# Patient Record
Sex: Male | Born: 2000 | Hispanic: Yes | Marital: Single | Smoking: Never smoker
Health system: Southern US, Community
[De-identification: ages and names within clinical notes are randomized; demographics above are authoritative.]

---
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
## 2023-04-16 ENCOUNTER — Emergency Department
Admission: EM | Admit: 2023-04-16 | Discharge: 2023-04-16 | Disposition: A | Discharge status: Home / Self Care | Payer: Medicaid Other | Attending: Emergency Medicine | Admitting: Emergency Medicine

## 2023-04-16 ENCOUNTER — Other Ambulatory Visit: Payer: Self-pay

## 2023-04-16 DIAGNOSIS — T7840XA Allergy, unspecified, initial encounter: Secondary | ICD-10-CM | POA: Diagnosis present

## 2023-04-16 DIAGNOSIS — T783XXA Angioneurotic edema, initial encounter: Secondary | ICD-10-CM | POA: Insufficient documentation

## 2023-04-16 MED ORDER — DEXAMETHASONE SODIUM PHOSPHATE 10 MG/ML IJ SOLN
10.0000 mg | Freq: Once | INTRAMUSCULAR | Status: AC
  Administered 2023-04-16: 10 mg via INTRAVENOUS
  Filled 2023-04-16: qty 1

## 2023-04-16 NOTE — ED Provider Notes (Signed)
[facility] Provider Note    None    (approximate)   History   Allergic Reaction   HPI  Jeremy Schmidt is a 22 y.o. male who presents to the ED for evaluation of Allergic Reaction   Review and ED visit from 2 years ago he was seen for facial swelling and diagnosed with angioedema.  Patient presents to the ED by POV for evaluation of "the same thing."  Reports symptoms started in the past 2-3 hours.  He took 50 mg of Benadryl around 11:30 PM, another 50 mg of Benadryl within 30 minutes of arrival.  Reports right sided periorbital facial swelling without trauma or clear precipitators.  No wheezing, pruritic spots on skin, no rash.  No abdominal pain or emesis.  No syncope, dizziness or falls.  Reports that he has never had provide himself an EpiPen in the past.  Takes no regular prescription medications including antihypertensives.  Reports he had a tight throat sensation prior to arrival since improved.  Reports he is feeling better   Physical Exam   Triage Vital Signs: ED Triage Vitals  Encounter Vitals Group     BP      Systolic BP Percentile      Diastolic BP Percentile      Pulse      Resp      Temp      Temp src      SpO2      Weight      Height      Head Circumference      Peak Flow      Pain Score      Pain Loc      Pain Education      Exclude from Growth Chart     Most recent vital signs: Vitals:   04/16/23 0215  BP: (!) 143/95  Pulse: 77  Resp: 18  Temp: 98 F (36.7 C)  SpO2: 100%    General: Awake, no distress.  CV:  Good peripheral perfusion.  Resp:  Normal effort.  Abd:  No distention.  MSK:  No deformity noted.  Neuro:  No focal deficits appreciated. Other:       ED Results / Procedures / Treatments   Labs (all labs ordered are listed, but only abnormal results are displayed) Labs Reviewed - No data to display  EKG   RADIOLOGY   Official radiology report(s): No results  found.  PROCEDURES and INTERVENTIONS:  .1-3 Lead EKG Interpretation  Performed by: Delton Prairie, MD Authorized by: Delton Prairie, MD     Interpretation: normal     ECG rate:  70   ECG rate assessment: normal     Rhythm: sinus rhythm     Ectopy: none     Conduction: normal     Medications  dexamethasone (DECADRON) injection 10 mg (10 mg Intravenous Given 04/16/23 0222)     IMPRESSION / MDM / ASSESSMENT AND PLAN / ED COURSE  I reviewed the triage vital signs and the nursing notes.  Differential diagnosis includes, but is not limited to, angioedema, trauma, anaphylaxis  {Patient presents with symptoms of an acute illness or injury that is potentially life-threatening.  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  Presents with resolving symptoms after taking 100 mg of p.o. Benadryl prior to arrival.  We add IV Decadron and observed for about 3 hours with continued improvement.  Suitable for outpatient management.  No clear precipitators.  Referred to outpatient allergy, discussed ED return precautions.  Clinical Course as of 04/16/23 0502  Caleen Essex Apr 16, 2023  0222 reassessed [DS]  0308 Reassessed. Improving some. No worse [DS]  0457 Reassessed ,  swelling has essentially resolved.  Looks well when he is ready to go. [DS]    Clinical Course User Index [DS] Delton Prairie, MD     FINAL CLINICAL IMPRESSION(S) / ED DIAGNOSES   Final diagnoses:  Angioedema, initial encounter     Rx / DC Orders   ED Discharge Orders     None        Note:  This document was prepared using Dragon voice recognition software and may include unintentional dictation errors.   Delton Prairie, MD 04/16/23 [phone]

## 2023-04-16 NOTE — ED Triage Notes (Signed)
Pt reports swelling to right eye and feeling scratchy in his throat that began around 2330 tonight, Pt states he took benadryl at 2330 that helped initially but swelling came back, pt also took 2 benadryl 30 min PTA. Pt denies difficulty breathing.

## 2023-04-16 NOTE — Discharge Instructions (Addendum)
Reach out to [facility]. They have a main office in [place], but also should have an office in [place]

## 2023-12-03 ENCOUNTER — Ambulatory Visit: Admitting: Physician Assistant

## 2024-01-06 ENCOUNTER — Other Ambulatory Visit: Payer: Self-pay

## 2024-01-06 ENCOUNTER — Encounter: Payer: Self-pay | Admitting: Physical Therapy

## 2024-01-06 ENCOUNTER — Ambulatory Visit: Attending: Orthopedic Surgery | Admitting: Physical Therapy

## 2024-01-06 DIAGNOSIS — M25561 Pain in right knee: Secondary | ICD-10-CM | POA: Diagnosis present

## 2024-01-06 DIAGNOSIS — R600 Localized edema: Secondary | ICD-10-CM | POA: Insufficient documentation

## 2024-01-06 DIAGNOSIS — M6281 Muscle weakness (generalized): Secondary | ICD-10-CM | POA: Diagnosis present

## 2024-01-06 DIAGNOSIS — R2681 Unsteadiness on feet: Secondary | ICD-10-CM | POA: Insufficient documentation

## 2024-01-06 NOTE — Therapy (Signed)
 OUTPATIENT PHYSICAL THERAPY LOWER EXTREMITY EVALUATION  Patient Name: Jeremy Schmidt MRN: [id number] DOB:07-05-2000, 23 y.o., male Today's Date: 01/06/2024   PT End of Session - 01/06/24 1545     Visit Number 1    Number of Visits --   1-2x/week   Date for PT Re-Evaluation 03/02/24    Authorization Type [employer]    PT Start Time 1500    PT Stop Time 1526    PT Time Calculation (min) 26 min          History reviewed. No pertinent past medical history. History reviewed. No pertinent surgical history. There are no active problems to display for this patient.   PCP: Jeremy Lenis, MD (Inactive)  REFERRING PROVIDER: Yvone Rush, MD  THERAPY DIAG:  Right knee pain, unspecified chronicity - Plan: PT plan of care cert/re-cert  Muscle weakness - Plan: PT plan of care cert/re-cert  Unsteadiness on feet - Plan: PT plan of care cert/re-cert  Localized edema - Plan: PT plan of care cert/re-cert  REFERRING DIAG: S/P R Lateral Meniscus Repair    Rationale for Evaluation and Treatment:  Rehabilitation  SUBJECTIVE:  PERTINENT PAST HISTORY:  S/P R lateral meniscus repair         PRECAUTIONS:   OP Date: 12/08/2023  2 weeks 12/22/2023  4 weeks 01/05/2024  6 weeks 01/19/2024  8 weeks 02/02/2024  10 weeks 02/16/2024  12 weeks 03/01/2024    WEIGHT BEARING RESTRICTIONS see protocol  FALLS:  Has patient fallen in last 6 months? No, Number of falls: 0  MOI/History of condition:  Onset date: 7/16  SUBJECTIVE STATEMENT  Pt is a 23 y.o. male who presents to clinic with chief complaint of R knee pain and stiffness following R lateral mensicus repair on 7/16.  He had a twisting injury when he rolled off the sofa and his foot was captured.  He plays baseball on the weekends.  He is not wearing a brace or using crutches on eval, states that these were provided but he wanted to make sure his knee still worked.  Provided protocol but did not bring to clinic   Red flags:  denies    Pain:  Are you having pain? 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  Assistive Device: na  Hand Dominance: na  Patient Goals/Specific Activities: get back to baseball and work   OBJECTIVE:   DIAGNOSTIC FINDINGS:  None available in epic  GENERAL OBSERVATION/GAIT: Antalgic gait, no brace, ace bandage  SENSATION: Light touch: Appears intact  PALPATION: Diffuse pain and mild swelling R knee   LE MMT:  MMT Right (Eval) Left (Eval)  Hip flexion (L2, L3)    Knee extension (L3)    Knee flexion    Hip abduction    Hip extension    Hip external rotation    Hip internal rotation    Hip adduction    Ankle dorsiflexion (L4)    Ankle plantarflexion (S1)    Ankle inversion    Ankle eversion    Great Toe ext (L5)    Grossly     (Blank rows = not tested, score listed is out of 5 possible points OR may be listed in lbs of force.  N = WNL, D = diminished, C = clear for gross weakness  with myotome testing, * = concordant pain with testing)  LE ROM:  ROM Right (Eval) Left (Eval)  Hip flexion    Hip extension    Hip abduction    Hip adduction    Hip internal rotation    Hip external rotation    Knee extension 12 0  Knee flexion 45 130  Ankle dorsiflexion    Ankle plantarflexion    Ankle inversion    Ankle eversion     (Blank rows = not tested, N = WNL, * = concordant pain with testing)  Functional Tests  Eval                                                             PATIENT SURVEYS:  LEFS: 50/80   TODAY'S TREATMENT:  Therapeutic Exercise: Creating, reviewing, and completing below HEP   PATIENT EDUCATION ([place]/HM):  POC, diagnosis, prognosis, HEP, and outcome measures.  Pt educated via explanation, demonstration, and  handout (HEP).  Pt confirms understanding verbally.   HOME EXERCISE PROGRAM: Access Code: [email or web address] URL: [email or web address] Date: 01/06/2024 Prepared by: Jeremy Schmidt  Exercises - Long Sitting Quad Set with Towel Roll Under Heel  - 3-4 x daily - 7 x weekly - 2 sets - 10 reps - 5'' hold - Supine Heel Slide with Strap  - 2 x daily - 7 x weekly - 3 sets - 10 reps  Treatment priorities   Eval        Progress strength and ROM per protocol                                          ASSESSMENT:  CLINICAL IMPRESSION: Jeremy Schmidt is a 23 y.o. male who presents to clinic with signs and sxs consistent with R knee pain and stiffness following R lateral mensicus repair on 7/16.  Provided knee brace and crutches by MD but d/c'd on his own.  Discussed importance of following restrictions which typically include partial w/b and brace at this point; he confirms understanding.  Pt will bring protocol tomorrow.  Jeremy Schmidt will benefit from skilled PT to address relevant deficits and improve knee strength and ROM to allow return to PLOF.   OBJECTIVE IMPAIRMENTS: Pain, LE strength, gait, balance, R knee ROM  ACTIVITY LIMITATIONS: standing, walking, squatting, lifting, running, recreation  PERSONAL FACTORS: See medical history and pertinent history   REHAB POTENTIAL: Good  CLINICAL DECISION MAKING: Evolving/moderate complexity  EVALUATION COMPLEXITY: Moderate   GOALS:   SHORT TERM GOALS: Target date: 02/03/2024   Jeremy Schmidt will be >75% HEP compliant to improve carryover between sessions and facilitate independent management of condition  Evaluation: ongoing Goal status: INITIAL   LONG TERM GOALS: Target date: 03/02/2024   Jeremy Schmidt will self report >/= 50% decrease in pain from evaluation to improve function in daily tasks  Evaluation/Baseline: 6/10 max pain Goal status: INITIAL   2.  Jeremy Schmidt will show a >/= 70/80 pt improvement in LEFS score (MCID is ~11% or 9 pts) as a  proxy for functional improvement   Evaluation/Baseline: 50 pts Goal status: INITIAL   3.  Jeremy Schmidt will be able to return to strength training, not limited by pain  Evaluation/Baseline: limited Goal status:  INITIAL   4.  Jeremy Schmidt will achieve full knee ROM to improve ability to complete transfers, squat, and navigate steps  Evaluation/Baseline: 12-45 degrees Goal status: INITIAL    PLAN: PT FREQUENCY: 1-2x/week  PT DURATION: 8 weeks  PLANNED INTERVENTIONS:  97164- PT Re-evaluation, 97110-Therapeutic exercises, 97530- Therapeutic activity, [id number]- Neuromuscular re-education, 97535- Self Care, 02859- Manual therapy, [id number]- Gait training, [id number]- Aquatic Therapy, [phone]- Electrical stimulation (manual), [id number]- Vasopneumatic device, [id number]- Traction (mechanical), [id number]- Ionotophoresis 4mg /ml Dexamethasone , Taping, Dry Needling, Joint manipulation, and Spinal manipulation.   Jeremy Schmidt PT, DPT 01/06/2024, 4:38 PM  I just finished a MCD eval/recert.  Name: Mung E Helf  MRN: [id number] Please request 2x/week for 8 weeks.  Check all conditions that are expected to impact treatment: Musculoskeletal disorders   I DID put a charge in.  Check all possible CPT codes: 02889- Therapeutic Exercise, [phone]- Neuro Re-education, [phone] - Gait Training, [phone] - Manual Therapy, 97530 - Therapeutic Activities, 97535 - Self Care, [phone] - Re-evaluation, [id number] - Mechanical traction, and [id number] - Aquatic therapy   Thank you!  MCD - Secure

## 2024-01-07 ENCOUNTER — Ambulatory Visit

## 2024-01-07 DIAGNOSIS — M25561 Pain in right knee: Secondary | ICD-10-CM

## 2024-01-07 DIAGNOSIS — R2681 Unsteadiness on feet: Secondary | ICD-10-CM

## 2024-01-07 DIAGNOSIS — R600 Localized edema: Secondary | ICD-10-CM

## 2024-01-07 DIAGNOSIS — M6281 Muscle weakness (generalized): Secondary | ICD-10-CM

## 2024-01-07 NOTE — Therapy (Signed)
 OUTPATIENT PHYSICAL THERAPY TREATMENT NOTE  Patient Name: Jeremy Schmidt MRN: [id number] DOB:April 03, 2001, 23 y.o., male Today's Date: 01/07/2024   PT End of Session - 01/07/24 1224     Visit Number 2    Date for PT Re-Evaluation 03/02/24    Authorization Type [employer]    PT Start Time 1220    PT Stop Time 1258    PT Time Calculation (min) 38 min         History reviewed. No pertinent past medical history. History reviewed. No pertinent surgical history. There are no active problems to display for this patient.   PCP: Dariel Lenis, MD (Inactive)  REFERRING PROVIDER: Yvone Rush, MD  THERAPY DIAG:  Right knee pain, unspecified chronicity  Muscle weakness  Unsteadiness on feet  Localized edema  REFERRING DIAG: S/P R Lateral Meniscus Repair    Rationale for Evaluation and Treatment:  Rehabilitation  SUBJECTIVE:  PERTINENT PAST HISTORY:  S/P R lateral meniscus repair         PRECAUTIONS:   OP Date: 12/08/2023  2 weeks 12/22/2023  4 weeks 01/05/2024  6 weeks 01/19/2024  8 weeks 02/02/2024  10 weeks 02/16/2024  12 weeks 03/01/2024    WEIGHT BEARING RESTRICTIONS see protocol  FALLS:  Has patient fallen in last 6 months? No, Number of falls: 0  MOI/History of condition:  Onset date: 7/16  SUBJECTIVE STATEMENT Patient reports no current pain, is wearing knee immobilizer and ace bandage today. Has done some of the quad sets at home.   EVAL: Pt is a 23 y.o. male who presents to clinic with chief complaint of R knee pain and stiffness following R lateral mensicus repair on 7/16.  He had a twisting injury when he rolled off the sofa and his foot was captured.  He plays baseball on the weekends.  He is not wearing a brace or using crutches on eval, states that these were provided but he wanted to make sure his knee still worked.  Provided protocol but did not bring to clinic   Red flags:  denies   Pain:  Are you having pain? 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  Assistive Device: na  Hand Dominance: na  Patient Goals/Specific Activities: get back to baseball and work   OBJECTIVE:   DIAGNOSTIC FINDINGS:  None available in epic  GENERAL OBSERVATION/GAIT: Antalgic gait, no brace, ace bandage  SENSATION: Light touch: Appears intact  PALPATION: Diffuse pain and mild swelling R knee   LE MMT:  MMT Right (Eval) Left (Eval)  Hip flexion (L2, L3)    Knee extension (L3)    Knee flexion    Hip abduction    Hip extension    Hip external rotation    Hip internal rotation    Hip adduction    Ankle dorsiflexion (L4)    Ankle plantarflexion (S1)    Ankle inversion    Ankle eversion    Great Toe ext (L5)    Grossly     (Blank rows = not tested, score listed is out of 5 possible points OR may be listed in lbs of force.  N = WNL, D = diminished, C = clear for gross weakness with myotome testing, * = concordant pain with testing)  LE ROM:  ROM Right (  Eval) Left (Eval) Right 01/07/24  Hip flexion     Hip extension     Hip abduction     Hip adduction     Hip internal rotation     Hip external rotation     Knee extension 12 0 7  Knee flexion 45 130 78  Ankle dorsiflexion     Ankle plantarflexion     Ankle inversion     Ankle eversion      (Blank rows = not tested, N = WNL, * = concordant pain with testing)  Functional Tests  Eval                                                             PATIENT SURVEYS:  LEFS: 50/80   TODAY'S TREATMENT:  Therapeutic Exercise: Creating, reviewing, and completing below HEP   PATIENT EDUCATION ([place]/HM):  POC, diagnosis, prognosis, HEP, and outcome measures.  Pt educated via explanation, demonstration, and handout (HEP).  Pt confirms understanding  verbally.   HOME EXERCISE PROGRAM: Access Code: [email or web address] URL: [email or web address] Date: 01/06/2024 Prepared by: Helene Gasmen  Exercises - Long Sitting Quad Set with Towel Roll Under Heel  - 3-4 x daily - 7 x weekly - 2 sets - 10 reps - 5'' hold - Supine Heel Slide with Strap  - 2 x daily - 7 x weekly - 3 sets - 10 reps  Treatment priorities   Eval        Progress strength and ROM per protocol                                          [facility] Adult PT Treatment:                                                DATE: 01/07/24 Therapeutic Exercise: Supine heel slide with strap 5 2x10 Sidelying hip abduction 2x10 Neuromuscular re-ed: Sitting EOM quad set 5 hold 2x10 SLR with quad set x10 (very slight quad lag on last 2 reps) Self Care: Education provided on postop protocol, expectations on PT, importance of completing HEP and adhering to protocol, making follow up appointments with surgeon   ASSESSMENT:  CLINICAL IMPRESSION: Patient presents to PT in immobilizer brace and ace bandage. He does have [facility] protocol provided from [facility]. Education provided on protocol regarding wearing brace during ambulation and sleeping and maintaining partial weight bearing with use of axillary crutches. Session today focused on quad activation and flexion ROM. He has a very slight quad lag towards the end of 10 repetitions of SLR. Patient was able to tolerate all prescribed exercises with no adverse effects. Patient continues to benefit from skilled PT services and should be progressed as able to improve functional independence.   EVAL: Jeremy Schmidt is a 23 y.o. male who presents to clinic with signs and sxs consistent with R knee pain and stiffness following R lateral mensicus repair on 7/16.  Provided knee brace and crutches by MD but d/c'd on his own.  Discussed importance of following restrictions which typically include partial  w/b and brace at this point; he confirms  understanding.  Pt will bring protocol tomorrow.  Jeremy Schmidt will benefit from skilled PT to address relevant deficits and improve knee strength and ROM to allow return to PLOF.   OBJECTIVE IMPAIRMENTS: Pain, LE strength, gait, balance, R knee ROM  ACTIVITY LIMITATIONS: standing, walking, squatting, lifting, running, recreation  PERSONAL FACTORS: See medical history and pertinent history   REHAB POTENTIAL: Good  CLINICAL DECISION MAKING: Evolving/moderate complexity  EVALUATION COMPLEXITY: Moderate   GOALS:   SHORT TERM GOALS: Target date: 02/03/2024   Galan will be >75% HEP compliant to improve carryover between sessions and facilitate independent management of condition  Evaluation: ongoing Goal status: INITIAL   LONG TERM GOALS: Target date: 03/02/2024   Jeremy Schmidt will self report >/= 50% decrease in pain from evaluation to improve function in daily tasks  Evaluation/Baseline: 6/10 max pain Goal status: INITIAL   2.  Jeremy Schmidt will show a >/= 70/80 pt improvement in LEFS score (MCID is ~11% or 9 pts) as a proxy for functional improvement   Evaluation/Baseline: 50 pts Goal status: INITIAL   3.  Jeremy Schmidt will be able to return to strength training, not limited by pain  Evaluation/Baseline: limited Goal status: INITIAL   4.  Jeremy Schmidt will achieve full knee ROM to improve ability to complete transfers, squat, and navigate steps  Evaluation/Baseline: 12-45 degrees Goal status: INITIAL    PLAN: PT FREQUENCY: 1-2x/week  PT DURATION: 8 weeks  PLANNED INTERVENTIONS:  97164- PT Re-evaluation, 97110-Therapeutic exercises, 97530- Therapeutic activity, [id number]- Neuromuscular re-education, 97535- Self Care, 02859- Manual therapy, [id number]- Gait training, [id number]- Aquatic Therapy, [phone]- Electrical stimulation (manual), [id number]- Vasopneumatic device, [id number]- Traction (mechanical), [id number]- Ionotophoresis 4mg /ml Dexamethasone , Taping, Dry Needling, Joint manipulation, and Spinal  manipulation.   Corean Pouch PTA  01/07/2024, 1:02 PM

## 2024-01-11 ENCOUNTER — Ambulatory Visit

## 2024-01-11 DIAGNOSIS — R600 Localized edema: Secondary | ICD-10-CM

## 2024-01-11 DIAGNOSIS — M25561 Pain in right knee: Secondary | ICD-10-CM | POA: Diagnosis not present

## 2024-01-11 DIAGNOSIS — R2681 Unsteadiness on feet: Secondary | ICD-10-CM

## 2024-01-11 DIAGNOSIS — M6281 Muscle weakness (generalized): Secondary | ICD-10-CM

## 2024-01-11 NOTE — Therapy (Signed)
 OUTPATIENT PHYSICAL THERAPY TREATMENT NOTE  Patient Name: Jeremy Schmidt MRN: [id number] DOB:12/18/2000, 23 y.o., male Today's Date: 01/11/2024   PT End of Session - 01/11/24 1447     Visit Number 3    Date for PT Re-Evaluation 03/02/24    Authorization Type [employer]    PT Start Time 1447    PT Stop Time 1525    PT Time Calculation (min) 38 min          History reviewed. No pertinent past medical history. History reviewed. No pertinent surgical history. There are no active problems to display for this patient.   PCP: Jeremy Lenis, MD (Inactive)  REFERRING PROVIDER: Yvone Rush, MD  THERAPY DIAG:  Right knee pain, unspecified chronicity  Muscle weakness  Unsteadiness on feet  Localized edema  REFERRING DIAG: S/P R Lateral Meniscus Repair    Rationale for Evaluation and Treatment:  Rehabilitation  SUBJECTIVE:  PERTINENT PAST HISTORY:  S/P R lateral meniscus repair         PRECAUTIONS:   OP Date: 12/08/2023  2 weeks 12/22/2023  4 weeks 01/05/2024  6 weeks 01/19/2024  8 weeks 02/02/2024  10 weeks 02/16/2024  12 weeks 03/01/2024    WEIGHT BEARING RESTRICTIONS see protocol  FALLS:  Has patient fallen in last 6 months? No, Number of falls: 0  MOI/History of condition:  Onset date: 7/16  SUBJECTIVE STATEMENT Pt presents to PT with reports of 5/10 R knee pain. Has been compliant with HEP.  EVAL: Pt is a 23 y.o. male who presents to clinic with chief complaint of R knee pain and stiffness following R lateral mensicus repair on 7/16.  He had a twisting injury when he rolled off the sofa and his foot was captured.  He plays baseball on the weekends.  He is not wearing a brace or using crutches on eval, states that these were provided but he wanted to make sure his knee still worked.  Provided protocol but did not bring to clinic   Red flags:  denies   Pain:  Are you having pain? 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  Assistive Device: na  Hand Dominance: na  Patient Goals/Specific Activities: get back to baseball and work   OBJECTIVE:   DIAGNOSTIC FINDINGS:  None available in epic  GENERAL OBSERVATION/GAIT: Antalgic gait, no brace, ace bandage  SENSATION: Light touch: Appears intact  PALPATION: Diffuse pain and mild swelling R knee   LE MMT:  MMT Right (Eval) Left (Eval)  Hip flexion (L2, L3)    Knee extension (L3)    Knee flexion    Hip abduction    Hip extension    Hip external rotation    Hip internal rotation    Hip adduction    Ankle dorsiflexion (L4)    Ankle plantarflexion (S1)    Ankle inversion    Ankle eversion    Great Toe ext (L5)    Grossly     (Blank rows = not tested, score listed is out of 5 possible points OR may be listed in lbs of force.  N = WNL, D = diminished, C = clear for gross weakness with myotome testing, * = concordant pain with testing)  LE ROM:  ROM Right (Eval) Left (Eval) Right 01/07/24 Right  01/11/24  Hip flexion      Hip extension      Hip abduction      Hip adduction      Hip internal rotation      Hip external rotation      Knee extension 12 0 7 5  Knee flexion 45 130 78 84  Ankle dorsiflexion      Ankle plantarflexion      Ankle inversion      Ankle eversion       (Blank rows = not tested, N = WNL, * = concordant pain with testing)  Functional Tests  Eval                                                             PATIENT SURVEYS:  LEFS: 50/80   TODAY'S TREATMENT:  Therapeutic Exercise: Creating, reviewing, and completing below HEP   PATIENT EDUCATION (Jeremy Schmidt/HM):  POC, diagnosis, prognosis, HEP, and outcome measures.  Pt educated via explanation, demonstration, and handout (HEP).  Pt confirms understanding verbally.    HOME EXERCISE PROGRAM: Access Code: [email or web address] URL: [email or web address] Date: 01/11/2024 Prepared by: Jeremy Schmidt  Exercises - Long Sitting Quad Set with Towel Roll Under Heel  - 3-4 x daily - 7 x weekly - 2 sets - 10 reps - 5'' hold - Supine Heel Slide with Strap  - 2 x daily - 7 x weekly - 3 sets - 10 reps - Prone Knee Extension Hang  - 1 x daily - 7 x weekly - 3 sets - 10 reps - 2-3 min hold - Sidelying Hip Abduction  - 1 x daily - 7 x weekly - 3 sets - 10 reps  Treatment priorities   Eval        Progress strength and ROM per protocol                                          [facility] Adult PT Treatment:                                                DATE: 01/11/24 Therapeutic Exercise: Supine QS with heel block 2x10 - 5 hold R Supine heel slide with strap 5 2x10 R Sidelying hip abduction 3x10 R Prone knee ext hang x 3 min Standing calf raises 2x20 Neuromuscular re-ed: Supine QS with heel block 3x10 - 5 hold R Seated QS 2x10 R - 5 hold R Modalities: Attended [place] ESTIM 5/5 (on/off) x 8 min to R quad GameReady 10 min  R knee elevated Medium compression 36 degrees  ASSESSMENT:  CLINICAL IMPRESSION: Pt was able to complete all prescribed exercises with no adverse effect. Today he was able to progress ROM in both R knee flexion and extension. Still not at full knee extension, HEP was updated for knee extension stretching and lateral hip strengthening. Pt is progressing well with therapy, will continue to progress as able per POC.   EVAL: Jeremy Schmidt is a 23 y.o. male who presents to clinic with signs and sxs consistent with R knee  pain and stiffness following R lateral mensicus repair on 7/16.  Provided knee brace and crutches by MD but d/c'd on his own.  Discussed importance of following restrictions which typically include partial w/b and brace at this point; he confirms understanding.  Pt will bring protocol tomorrow.  Jeremy Schmidt will benefit from  skilled PT to address relevant deficits and improve knee strength and ROM to allow return to PLOF.   OBJECTIVE IMPAIRMENTS: Pain, LE strength, gait, balance, R knee ROM  ACTIVITY LIMITATIONS: standing, walking, squatting, lifting, running, recreation  PERSONAL FACTORS: See medical history and pertinent history   REHAB POTENTIAL: Good  CLINICAL DECISION MAKING: Evolving/moderate complexity  EVALUATION COMPLEXITY: Moderate   GOALS:   SHORT TERM GOALS: Target date: 02/03/2024   Aria will be >75% HEP compliant to improve carryover between sessions and facilitate independent management of condition  Evaluation: ongoing Goal status: MET   LONG TERM GOALS: Target date: 03/02/2024   Talis will self report >/= 50% decrease in pain from evaluation to improve function in daily tasks  Evaluation/Baseline: 6/10 max pain Goal status: INITIAL   2.  Mohsin will show a >/= 70/80 pt improvement in LEFS score (MCID is ~11% or 9 pts) as a proxy for functional improvement   Evaluation/Baseline: 50 pts Goal status: INITIAL   3.  Acelin will be able to return to strength training, not limited by pain  Evaluation/Baseline: limited Goal status: INITIAL   4.  Fatih will achieve full knee ROM to improve ability to complete transfers, squat, and navigate steps  Evaluation/Baseline: 12-45 degrees Goal status: INITIAL    PLAN: PT FREQUENCY: 1-2x/week  PT DURATION: 8 weeks  PLANNED INTERVENTIONS:  97164- PT Re-evaluation, 97110-Therapeutic exercises, 97530- Therapeutic activity, [id number]- Neuromuscular re-education, 97535- Self Care, 02859- Manual therapy, [id number]- Gait training, [id number]- Aquatic Therapy, [phone]- Electrical stimulation (manual), [id number]- Vasopneumatic device, [id number]- Traction (mechanical), [id number]- Ionotophoresis 4mg /ml Dexamethasone , Taping, Dry Needling, Joint manipulation, and Spinal manipulation.   Jeremy JAYSON Schmidt PT  01/11/2024, 3:44 PM

## 2024-01-13 ENCOUNTER — Ambulatory Visit

## 2024-01-13 DIAGNOSIS — R600 Localized edema: Secondary | ICD-10-CM

## 2024-01-13 DIAGNOSIS — M25561 Pain in right knee: Secondary | ICD-10-CM | POA: Diagnosis not present

## 2024-01-13 DIAGNOSIS — M6281 Muscle weakness (generalized): Secondary | ICD-10-CM

## 2024-01-13 DIAGNOSIS — R2681 Unsteadiness on feet: Secondary | ICD-10-CM

## 2024-01-13 NOTE — Therapy (Signed)
 OUTPATIENT PHYSICAL THERAPY TREATMENT NOTE  Patient Name: Jeremy Schmidt MRN: [id number] DOB:2000/12/08, 23 y.o., male Today's Date: 01/13/2024   PT End of Session - 01/13/24 1314     Visit Number 4    Date for PT Re-Evaluation 03/02/24    Authorization Type [employer]    PT Start Time 1315    PT Stop Time 1358    PT Time Calculation (min) 43 min    Activity Tolerance Patient tolerated treatment well    Behavior During Therapy [facility] for tasks assessed/performed           History reviewed. No pertinent past medical history. History reviewed. No pertinent surgical history. There are no active problems to display for this patient.   PCP: Dariel Lenis, MD (Inactive)  REFERRING PROVIDER: Yvone Rush, MD  THERAPY DIAG:  Right knee pain, unspecified chronicity  Muscle weakness  Unsteadiness on feet  Localized edema  REFERRING DIAG: S/P R Lateral Meniscus Repair    Rationale for Evaluation and Treatment:  Rehabilitation  SUBJECTIVE:  PERTINENT PAST HISTORY:  S/P R lateral meniscus repair         PRECAUTIONS:   OP Date: 12/08/2023  2 weeks 12/22/2023  4 weeks 01/05/2024  6 weeks 01/19/2024  8 weeks 02/02/2024  10 weeks 02/16/2024  12 weeks 03/01/2024    WEIGHT BEARING RESTRICTIONS see protocol  FALLS:  Has patient fallen in last 6 months? No, Number of falls: 0  MOI/History of condition:  Onset date: 7/16  SUBJECTIVE STATEMENT Pt comes in with less pain, 4/10. Felt good after last session.   EVAL: Pt is a 23 y.o. male who presents to clinic with chief complaint of R knee pain and stiffness following R lateral mensicus repair on 7/16.  He had a twisting injury when he rolled off the sofa and his foot was captured.  He plays baseball on the weekends.  He is not wearing a brace or using crutches on eval, states that these were provided but he wanted to make sure his knee still worked.  Provided protocol but did not bring to clinic   Red flags:  denies   Pain:   Are you having pain? 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  Assistive Device: na  Hand Dominance: na  Patient Goals/Specific Activities: get back to baseball and work   OBJECTIVE:   DIAGNOSTIC FINDINGS:  None available in epic  GENERAL OBSERVATION/GAIT: Antalgic gait, no brace, ace bandage  SENSATION: Light touch: Appears intact  PALPATION: Diffuse pain and mild swelling R knee   LE MMT:  MMT Right (Eval) Left (Eval)  Hip flexion (L2, L3)    Knee extension (L3)    Knee flexion    Hip abduction    Hip extension    Hip external rotation    Hip internal rotation    Hip adduction    Ankle dorsiflexion (L4)    Ankle plantarflexion (S1)    Ankle inversion    Ankle eversion    Great Toe ext (L5)    Grossly     (Blank rows = not tested, score listed is out of 5 possible points OR may be listed in lbs of force.  N = WNL, D = diminished, C = clear for gross weakness with myotome testing, * =  concordant pain with testing)  LE ROM:  ROM Right (Eval) Left (Eval) Right 01/07/24 Right 01/11/24 Right 01/13/24  Hip flexion       Hip extension       Hip abduction       Hip adduction       Hip internal rotation       Hip external rotation       Knee extension 12 0 7 5 3, 0   Knee flexion 45 130 78 84 82  Ankle dorsiflexion       Ankle plantarflexion       Ankle inversion       Ankle eversion        (Blank rows = not tested, N = WNL, * = concordant pain with testing)  Functional Tests  Eval                                                             PATIENT SURVEYS:  LEFS: 50/80   TODAY'S TREATMENT:  Therapeutic Exercise: Creating, reviewing, and completing below HEP   PATIENT EDUCATION ([place]/[facility]):  POC, diagnosis,  prognosis, HEP, and outcome measures.  Pt educated via explanation, demonstration, and handout (HEP).  Pt confirms understanding verbally.   HOME EXERCISE PROGRAM: Access Code: [email or web address] URL: [email or web address] Date: 01/11/2024 Prepared by: Alm Kingdom  Exercises - Long Sitting Quad Set with Towel Roll Under Heel  - 3-4 x daily - 7 x weekly - 2 sets - 10 reps - 5'' hold - Supine Heel Slide with Strap  - 2 x daily - 7 x weekly - 3 sets - 10 reps - Prone Knee Extension Hang  - 1 x daily - 7 x weekly - 3 sets - 10 reps - 2-3 min hold - Sidelying Hip Abduction  - 1 x daily - 7 x weekly - 3 sets - 10 reps  Treatment priorities   Eval        Progress strength and ROM per protocol                                           TREATMENT [facility] Adult PT Treatment:                                      DATE: 01/13/24 Therapeutic Exercise: Supine QS with heel block 1x10 - 5 hold R Supine heel slide with strap 5 2x10 R Sidelying hip abduction 1x10 2x15 Standing calf raises 2x20 SLR with QS 2 x 10 Knee extension isometrics with mobilization seatbelt 2x10  SL balance  Lateral step over   Modalities: Attended [place] ESTIM 5/5 (on/off) x 8 min to R quad GameReady 10 min  R knee elevated Medium compression 36 degrees  [facility] Adult PT Treatment:                                                DATE: 01/11/24 Therapeutic Exercise: Supine QS with heel block 2x10 - 5  hold R Supine heel slide with strap 5 2x10 R Sidelying hip abduction 3x10 R Prone knee ext hang x 3 min Standing calf raises 2x20 Neuromuscular re-ed: Supine QS with heel block 3x10 - 5 hold R Seated QS 2x10 R - 5 hold R Modalities: Attended [place] ESTIM 5/5 (on/off) x 8 min to R quad GameReady 10 min  R knee elevated Medium compression 36 degrees  ASSESSMENT:  CLINICAL IMPRESSION: Pt was able to complete all prescribed exercises with no adverse effect. Today he was able to progress  ROM and achieved full knee extension. Worked on improving quad activation, knee isometrics and single leg weight acceptance today. Educated on descending stairs with walker and affected side first. Pt is progressing well with therapy, will continue to progress as able per POC.   EVAL: Jeremy Schmidt is a 23 y.o. male who presents to clinic with signs and sxs consistent with R knee pain and stiffness following R lateral mensicus repair on 7/16.  Provided knee brace and crutches by MD but d/c'd on his own.  Discussed importance of following restrictions which typically include partial w/b and brace at this point; he confirms understanding.  Pt will bring protocol tomorrow.  Jeremy Schmidt will benefit from skilled PT to address relevant deficits and improve knee strength and ROM to allow return to PLOF.   OBJECTIVE IMPAIRMENTS: Pain, LE strength, gait, balance, R knee ROM  ACTIVITY LIMITATIONS: standing, walking, squatting, lifting, running, recreation  PERSONAL FACTORS: See medical history and pertinent history   REHAB POTENTIAL: Good  CLINICAL DECISION MAKING: Evolving/moderate complexity  EVALUATION COMPLEXITY: Moderate   GOALS:   SHORT TERM GOALS: Target date: 02/03/2024   Jeremy Schmidt will be >75% HEP compliant to improve carryover between sessions and facilitate independent management of condition  Evaluation: ongoing Goal status: MET   LONG TERM GOALS: Target date: 03/02/2024   Jeremy Schmidt will self report >/= 50% decrease in pain from evaluation to improve function in daily tasks  Evaluation/Baseline: 6/10 max pain Goal status: INITIAL   2.  Jeremy Schmidt will show a >/= 70/80 pt improvement in LEFS score (MCID is ~11% or 9 pts) as a proxy for functional improvement   Evaluation/Baseline: 50 pts Goal status: INITIAL   3.  Jeremy Schmidt will be able to return to strength training, not limited by pain  Evaluation/Baseline: limited Goal status: INITIAL   4.  Jeremy Schmidt will achieve full knee ROM to improve  ability to complete transfers, squat, and navigate steps  Evaluation/Baseline: 12-45 degrees Goal status: INITIAL    PLAN: PT FREQUENCY: 1-2x/week  PT DURATION: 8 weeks  PLANNED INTERVENTIONS:  97164- PT Re-evaluation, 97110-Therapeutic exercises, 97530- Therapeutic activity, [id number]- Neuromuscular re-education, 97535- Self Care, [id number]- Manual therapy, [id number]- Gait training, [id number]- Aquatic Therapy, [phone]- Electrical stimulation (manual), [id number]- Vasopneumatic device, [id number]- Traction (mechanical), [id number]- Ionotophoresis 4mg /ml Dexamethasone , Taping, Dry Needling, Joint manipulation, and Spinal manipulation.   Jeremy Schmidt, SPT 01/13/24 2:00 PM

## 2024-01-18 ENCOUNTER — Ambulatory Visit

## 2024-01-18 DIAGNOSIS — M6281 Muscle weakness (generalized): Secondary | ICD-10-CM

## 2024-01-18 DIAGNOSIS — M25561 Pain in right knee: Secondary | ICD-10-CM

## 2024-01-18 NOTE — Therapy (Signed)
 OUTPATIENT PHYSICAL THERAPY TREATMENT NOTE  Patient Name: Jeremy Schmidt MRN: [id number] DOB:2000-08-30, 23 y.o., male Today's Date: 01/18/2024   PT End of Session - 01/18/24 1313     Visit Number 5    Date for PT Re-Evaluation 03/02/24    Authorization Type [employer]    PT Start Time 1315    PT Stop Time 1355    PT Time Calculation (min) 40 min    Activity Tolerance Patient tolerated treatment well    Behavior During Therapy [facility] for tasks assessed/performed            History reviewed. No pertinent past medical history. History reviewed. No pertinent surgical history. There are no active problems to display for this patient.   PCP: Dariel Lenis, MD (Inactive)  REFERRING PROVIDER: Yvone Rush, MD  THERAPY DIAG:  Right knee pain, unspecified chronicity  Muscle weakness  REFERRING DIAG: S/P R Lateral Meniscus Repair    Rationale for Evaluation and Treatment:  Rehabilitation  SUBJECTIVE:  PERTINENT PAST HISTORY:  S/P R lateral meniscus repair         PRECAUTIONS:   OP Date: 12/08/2023  2 weeks 12/22/2023  4 weeks 01/05/2024  6 weeks 01/19/2024  8 weeks 02/02/2024  10 weeks 02/16/2024  12 weeks 03/01/2024    WEIGHT BEARING RESTRICTIONS see protocol  FALLS:  Has patient fallen in last 6 months? No, Number of falls: 0  MOI/History of condition:  Onset date: 7/16  SUBJECTIVE STATEMENT Pt presents to PT with no current pain. Has been compliant with HEP.   EVAL: Pt is a 23 y.o. male who presents to clinic with chief complaint of R knee pain and stiffness following R lateral mensicus repair on 7/16.  He had a twisting injury when he rolled off the sofa and his foot was captured.  He plays baseball on the weekends.  He is not wearing a brace or using crutches on eval, states that these were provided but he wanted to make sure his knee still worked.  Provided protocol but did not bring to clinic   Red flags:  denies   Pain:  Are you having pain? 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  Assistive Device: na  Hand Dominance: na  Patient Goals/Specific Activities: get back to baseball and work   OBJECTIVE:   DIAGNOSTIC FINDINGS:  None available in epic  GENERAL OBSERVATION/GAIT: Antalgic gait, no brace, ace bandage  SENSATION: Light touch: Appears intact  PALPATION: Diffuse pain and mild swelling R knee   LE MMT:  MMT Right (Eval) Left (Eval)  Hip flexion (L2, L3)    Knee extension (L3)    Knee flexion    Hip abduction    Hip extension    Hip external rotation    Hip internal rotation    Hip adduction    Ankle dorsiflexion (L4)    Ankle plantarflexion (S1)    Ankle inversion    Ankle eversion    Great Toe ext (L5)    Grossly     (Blank rows = not tested, score listed is out of 5 possible points OR may be listed in lbs of force.  N = WNL, D = diminished, C = clear for gross weakness with myotome testing, * = concordant pain with testing)  LE ROM:  ROM Right (Eval) Left (Eval) Right 01/07/24 Right 01/11/24 Right 01/13/24 Right 01/18/24  Hip flexion        Hip extension        Hip abduction        Hip adduction        Hip internal rotation        Hip external rotation        Knee extension 12 0 7 5 3, 0  2  Knee flexion 45 130 78 84 82 94  Ankle dorsiflexion        Ankle plantarflexion        Ankle inversion        Ankle eversion         (Blank rows = not tested, N = WNL, * = concordant pain with testing)  Functional Tests  Eval                                                             PATIENT SURVEYS:  LEFS: 50/80   TODAY'S TREATMENT:  Therapeutic Exercise: Creating, reviewing, and completing below HEP   PATIENT EDUCATION ([place]/HM):  POC, diagnosis,  prognosis, HEP, and outcome measures.  Pt educated via explanation, demonstration, and handout (HEP).  Pt confirms understanding verbally.   HOME EXERCISE PROGRAM: Access Code: [email or web address] URL: [email or web address] Date: 01/11/2024 Prepared by: Alm Kingdom  Exercises - Long Sitting Quad Set with Towel Roll Under Heel  - 3-4 x daily - 7 x weekly - 2 sets - 10 reps - 5'' hold - Supine Heel Slide with Strap  - 2 x daily - 7 x weekly - 3 sets - 10 reps - Prone Knee Extension Hang  - 1 x daily - 7 x weekly - 3 sets - 10 reps - 2-3 min hold - Sidelying Hip Abduction  - 1 x daily - 7 x weekly - 3 sets - 10 reps  Treatment priorities   Eval        Progress strength and ROM per protocol                                           TREATMENT [facility] Adult PT Treatment:                                      DATE: 01/18/24 Therapeutic Exercise: Supine SLR with QS 2x10 R Bridge LE on swiss 2x10  S/L hip abd 2x15 R Supine heel slide with strap 5 x 10 R S/L clam 2x15 RTB R Standing calf raises x 20 Wall squat 2x10 Neuromuscular Re-Ed:  Supine QS with heel block x 10 - 5 hold R Attended [place] ESTIM 5/5 (on/off) x 8 min to R quad Wobble board fwd/bwd 2x45 Modalities: GameReady 10 min  R knee elevated Medium compression 36 degrees  [facility] Adult PT Treatment:                                      DATE: 01/13/24 Therapeutic Exercise: Supine  QS with heel block 1x10 - 5 hold R Supine heel slide with strap 5 2x10 R Sidelying hip abduction 1x10 2x15 Standing calf raises 2x20 SLR with QS 2 x 10 Knee extension isometrics with mobilization seatbelt 2x10  SL balance  Lateral step over  Modalities: Attended [place] ESTIM 5/5 (on/off) x 8 min to R quad GameReady 10 min  R knee elevated Medium compression 36 degrees  [facility] Adult PT Treatment:                                                DATE: 01/11/24 Therapeutic Exercise: Supine QS with heel block 2x10 - 5  hold R Supine heel slide with strap 5 2x10 R Sidelying hip abduction 3x10 R Prone knee ext hang x 3 min Standing calf raises 2x20 Neuromuscular re-ed: Supine QS with heel block 3x10 - 5 hold R Seated QS 2x10 R - 5 hold R Modalities: Attended [place] ESTIM 5/5 (on/off) x 8 min to R quad GameReady 10 min  R knee elevated Medium compression 36 degrees  ASSESSMENT:  CLINICAL IMPRESSION: Pt was able to complete all prescribed exercises with no adverse effect. Exercises once again progressed quad and LE strength/balance per protocol. Continues to improve R knee ROM post op. Will continue to progress as tolerated per POC.   EVAL: Mataeo is a 23 y.o. male who presents to clinic with signs and sxs consistent with R knee pain and stiffness following R lateral mensicus repair on 7/16.  Provided knee brace and crutches by MD but d/c'd on his own.  Discussed importance of following restrictions which typically include partial w/b and brace at this point; he confirms understanding.  Pt will bring protocol tomorrow.  Kervin will benefit from skilled PT to address relevant deficits and improve knee strength and ROM to allow return to PLOF.   OBJECTIVE IMPAIRMENTS: Pain, LE strength, gait, balance, R knee ROM  ACTIVITY LIMITATIONS: standing, walking, squatting, lifting, running, recreation  PERSONAL FACTORS: See medical history and pertinent history   REHAB POTENTIAL: Good  CLINICAL DECISION MAKING: Evolving/moderate complexity  EVALUATION COMPLEXITY: Moderate   GOALS:   SHORT TERM GOALS: Target date: 02/03/2024   Christia will be >75% HEP compliant to improve carryover between sessions and facilitate independent management of condition  Evaluation: ongoing Goal status: MET   LONG TERM GOALS: Target date: 03/02/2024   Alazar will self report >/= 50% decrease in pain from evaluation to improve function in daily tasks  Evaluation/Baseline: 6/10 max pain Goal status:  INITIAL   2.  Sava will show a >/= 70/80 pt improvement in LEFS score (MCID is ~11% or 9 pts) as a proxy for functional improvement   Evaluation/Baseline: 50 pts Goal status: INITIAL   3.  Hyder will be able to return to strength training, not limited by pain  Evaluation/Baseline: limited Goal status: INITIAL   4.  Tevin will achieve full knee ROM to improve ability to complete transfers, squat, and navigate steps  Evaluation/Baseline: 12-45 degrees Goal status: INITIAL    PLAN: PT FREQUENCY: 1-2x/week  PT DURATION: 8 weeks  PLANNED INTERVENTIONS:  97164- PT Re-evaluation, 97110-Therapeutic exercises, 97530- Therapeutic activity, [id number]- Neuromuscular re-education, 97535- Self Care, 02859- Manual therapy, [id number]- Gait training, [id number]- Aquatic Therapy, [phone]- Electrical stimulation (manual), [id number]- Vasopneumatic device, [id number]- Traction (mechanical), [id number]- Ionotophoresis 4mg /ml Dexamethasone , Taping, Dry Needling, Joint manipulation,  and Spinal manipulation.   Alm JAYSON Kingdom PT  01/18/24 1:59 PM

## 2024-01-20 ENCOUNTER — Ambulatory Visit

## 2024-01-20 DIAGNOSIS — R2681 Unsteadiness on feet: Secondary | ICD-10-CM

## 2024-01-20 DIAGNOSIS — M25561 Pain in right knee: Secondary | ICD-10-CM

## 2024-01-20 DIAGNOSIS — R600 Localized edema: Secondary | ICD-10-CM

## 2024-01-20 DIAGNOSIS — M6281 Muscle weakness (generalized): Secondary | ICD-10-CM

## 2024-01-20 NOTE — Therapy (Signed)
 OUTPATIENT PHYSICAL THERAPY TREATMENT NOTE  Patient Name: Jeremy Schmidt MRN: [id number] DOB:15-Jul-2000, 23 y.o., male Today's Date: 01/20/2024   PT End of Session - 01/20/24 1313     Visit Number 6    Date for PT Re-Evaluation 03/02/24    Authorization Type [employer]    Authorization Time Period Approved 8 visits 01/06/24-03/06/24    Authorization - Visit Number 6    Authorization - Number of Visits 8    PT Start Time 1315    PT Stop Time 1357    PT Time Calculation (min) 42 min    Activity Tolerance Patient tolerated treatment well    Behavior During Therapy [facility] for tasks assessed/performed             History reviewed. No pertinent past medical history. History reviewed. No pertinent surgical history. There are no active problems to display for this patient.   PCP: Dariel Lenis, MD (Inactive)  REFERRING PROVIDER: Yvone Rush, MD  THERAPY DIAG:  Right knee pain, unspecified chronicity  Muscle weakness  Unsteadiness on feet  Localized edema  REFERRING DIAG: S/P R Lateral Meniscus Repair    Rationale for Evaluation and Treatment:  Rehabilitation  SUBJECTIVE:  PERTINENT PAST HISTORY:  S/P R lateral meniscus repair         PRECAUTIONS:   OP Date: 12/08/2023  2 weeks 12/22/2023  4 weeks 01/05/2024  6 weeks 01/19/2024  8 weeks 02/02/2024  10 weeks 02/16/2024  12 weeks 03/01/2024    WEIGHT BEARING RESTRICTIONS see protocol  FALLS:  Has patient fallen in last 6 months? No, Number of falls: 0  MOI/History of condition:  Onset date: 7/16  SUBJECTIVE STATEMENT Pt presents to PT with no current pain. Has been compliant with HEP.   EVAL: Pt is a 23 y.o. male who presents to clinic with chief complaint of R knee pain and stiffness following R lateral mensicus repair on 7/16.  He had a twisting injury when he rolled off the sofa and his foot was captured.  He plays baseball on the weekends.  He is not wearing a brace or using crutches on eval, states that  these were provided but he wanted to make sure his knee still worked.  Provided protocol but did not bring to clinic   Red flags:  denies   Pain:  Are you having pain? 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  Assistive Device: na  Hand Dominance: na  Patient Goals/Specific Activities: get back to baseball and work   OBJECTIVE:   DIAGNOSTIC FINDINGS:  None available in epic  GENERAL OBSERVATION/GAIT: Antalgic gait, no brace, ace bandage  SENSATION: Light touch: Appears intact  PALPATION: Diffuse pain and mild swelling R knee   LE MMT:  MMT Right (Eval) Left (Eval)  Hip flexion (L2, L3)    Knee extension (L3)    Knee flexion    Hip abduction    Hip extension    Hip external rotation    Hip internal rotation    Hip adduction    Ankle dorsiflexion (L4)    Ankle plantarflexion (S1)    Ankle inversion    Ankle eversion    Great Toe ext (L5)    Grossly     (Blank rows = not tested, score listed is  out of 5 possible points OR may be listed in lbs of force.  N = WNL, D = diminished, C = clear for gross weakness with myotome testing, * = concordant pain with testing)  LE ROM:  ROM Right (Eval) Left (Eval) Right 01/07/24 Right 01/11/24 Right 01/13/24 Right 01/18/24  Hip flexion        Hip extension        Hip abduction        Hip adduction        Hip internal rotation        Hip external rotation        Knee extension 12 0 7 5 3, 0  2  Knee flexion 45 130 78 84 82 94  Ankle dorsiflexion        Ankle plantarflexion        Ankle inversion        Ankle eversion         (Blank rows = not tested, N = WNL, * = concordant pain with testing)  Functional Tests  Eval                                                              PATIENT SURVEYS:  LEFS: 50/80   TODAY'S TREATMENT:  Therapeutic Exercise: Creating, reviewing, and completing below HEP   PATIENT EDUCATION ([place]/[facility]):  POC, diagnosis, prognosis, HEP, and outcome measures.  Pt educated via explanation, demonstration, and handout (HEP).  Pt confirms understanding verbally.   HOME EXERCISE PROGRAM: Access Code: [email or web address] URL: [email or web address] Date: 01/11/2024 Prepared by: Alm Kingdom  Exercises - Long Sitting Quad Set with Towel Roll Under Heel  - 3-4 x daily - 7 x weekly - 2 sets - 10 reps - 5'' hold - Supine Heel Slide with Strap  - 2 x daily - 7 x weekly - 3 sets - 10 reps - Prone Knee Extension Hang  - 1 x daily - 7 x weekly - 3 sets - 10 reps - 2-3 min hold - Sidelying Hip Abduction  - 1 x daily - 7 x weekly - 3 sets - 10 reps  Treatment priorities   Eval        Progress strength and ROM per protocol                                           TREATMENT [facility] Adult PT Treatment:                                      DATE: 01/20/24 Therapeutic Exercise: Rec bike no resistance x 3 min for flexion Standing hip abd 2x10 25lb ea Standing hip ext 2x10 25# R Single leg press 3x8 20lb Prone hamstring curl 2x10 R Prone quad stretch with strap 2x30 R Supine SLR with QS 3x10 R Bridge LE on swiss 2x15 TRX quats 2x10 (0-60) Standing calf raises x 20 Wall squat 2x10 (0-60) TKE with ball x 10 - 5 hold R Neuromuscular Re-Ed:  Supine QS with heel block x 15 - 5 hold R Tandem stance on foam 3x30 R back  Wobble board fwd/bwd 2x45 SLS on foam 2x30 R  [facility] Adult PT Treatment:                                      DATE: 01/18/24 Therapeutic Exercise: Supine SLR with QS 2x10 R Bridge LE on swiss 2x10  S/L hip abd 2x15 R Supine heel slide with strap 5 x 10 R S/L clam 2x15 RTB R Standing calf raises x 20 Wall squat 2x10 Neuromuscular Re-Ed:  Supine QS with heel block x 10 - 5 hold R Attended [place] ESTIM 5/5  (on/off) x 8 min to R quad Wobble board fwd/bwd 2x45  [facility] Adult PT Treatment:                                      DATE: 01/13/24 Therapeutic Exercise: Supine QS with heel block 1x10 - 5 hold R Supine heel slide with strap 5 2x10 R Sidelying hip abduction 1x10 2x15 Standing calf raises 2x20 SLR with QS 2 x 10 Knee extension isometrics with mobilization seatbelt 2x10  SL balance  Lateral step over  Modalities: Attended [place] ESTIM 5/5 (on/off) x 8 min to R quad GameReady 10 min  R knee elevated Medium compression 36 degrees  [facility] Adult PT Treatment:                                                DATE: 01/11/24 Therapeutic Exercise: Supine QS with heel block 2x10 - 5 hold R Supine heel slide with strap 5 2x10 R Sidelying hip abduction 3x10 R Prone knee ext hang x 3 min Standing calf raises 2x20 Neuromuscular re-ed: Supine QS with heel block 3x10 - 5 hold R Seated QS 2x10 R - 5 hold R Modalities: Attended [place] ESTIM 5/5 (on/off) x 8 min to R quad GameReady 10 min  R knee elevated Medium compression 36 degrees  ASSESSMENT:  CLINICAL IMPRESSION: Pt was able to complete all prescribed exercises with no adverse effect. Exercises once again progressed quad and LE strength/balance per protocol. Continues to improve R knee ROM post op. Will continue to progress as tolerated per POC.   EVAL: Rachit is a 23 y.o. male who presents to clinic with signs and sxs consistent with R knee pain and stiffness following R lateral mensicus repair on 7/16.  Provided knee brace and crutches by MD but d/c'd on his own.  Discussed importance of following restrictions which typically include partial w/b and brace at this point; he confirms understanding.  Pt will bring protocol tomorrow.  Caster will benefit from skilled PT to address relevant deficits and improve knee strength and ROM to allow return to PLOF.   OBJECTIVE IMPAIRMENTS: Pain, LE strength, gait, balance, R  knee ROM  ACTIVITY LIMITATIONS: standing, walking, squatting, lifting, running, recreation  PERSONAL FACTORS: See medical history and pertinent history   REHAB POTENTIAL: Good  CLINICAL DECISION MAKING: Evolving/moderate complexity  EVALUATION COMPLEXITY: Moderate   GOALS:   SHORT TERM GOALS: Target date: 02/03/2024   Mikal will be >75% HEP compliant to improve carryover between sessions and facilitate independent management of condition  Evaluation: ongoing Goal status: MET   LONG TERM GOALS: Target date:  03/02/2024   Adeel will self report >/= 50% decrease in pain from evaluation to improve function in daily tasks  Evaluation/Baseline: 6/10 max pain Goal status: INITIAL   2.  Zariah will show a >/= 70/80 pt improvement in LEFS score (MCID is ~11% or 9 pts) as a proxy for functional improvement   Evaluation/Baseline: 50 pts Goal status: INITIAL   3.  Hulet will be able to return to strength training, not limited by pain  Evaluation/Baseline: limited Goal status: INITIAL   4.  Makya will achieve full knee ROM to improve ability to complete transfers, squat, and navigate steps  Evaluation/Baseline: 12-45 degrees Goal status: INITIAL    PLAN: PT FREQUENCY: 1-2x/week  PT DURATION: 8 weeks  PLANNED INTERVENTIONS:  97164- PT Re-evaluation, 97110-Therapeutic exercises, 97530- Therapeutic activity, [id number]- Neuromuscular re-education, 97535- Self Care, [id number]- Manual therapy, [id number]- Gait training, [id number]- Aquatic Therapy, [phone]- Electrical stimulation (manual), [id number]- Vasopneumatic device, [id number]- Traction (mechanical), [id number]- Ionotophoresis 4mg /ml Dexamethasone , Taping, Dry Needling, Joint manipulation, and Spinal manipulation.   Alm JAYSON Kingdom PT  01/20/24 1:57 PM

## 2024-01-25 ENCOUNTER — Ambulatory Visit: Attending: Orthopedic Surgery

## 2024-01-25 DIAGNOSIS — R600 Localized edema: Secondary | ICD-10-CM | POA: Insufficient documentation

## 2024-01-25 DIAGNOSIS — M25561 Pain in right knee: Secondary | ICD-10-CM | POA: Insufficient documentation

## 2024-01-25 DIAGNOSIS — R2681 Unsteadiness on feet: Secondary | ICD-10-CM | POA: Insufficient documentation

## 2024-01-25 DIAGNOSIS — M6281 Muscle weakness (generalized): Secondary | ICD-10-CM | POA: Insufficient documentation

## 2024-01-25 NOTE — Therapy (Signed)
 OUTPATIENT PHYSICAL THERAPY TREATMENT NOTE  Patient Name: Jeremy Schmidt MRN: [id number] DOB:13-Jun-2000, 23 y.o., male Today's Date: 01/25/2024   PT End of Session - 01/25/24 1313     Visit Number 7    Date for PT Re-Evaluation 03/02/24    Authorization Type [employer]    Authorization Time Period Approved 8 visits 01/06/24-03/06/24    Authorization - Visit Number 7    Authorization - Number of Visits 8    PT Start Time 1315    PT Stop Time 1358    PT Time Calculation (min) 43 min    Activity Tolerance Patient tolerated treatment well    Behavior During Therapy [facility] for tasks assessed/performed              History reviewed. No pertinent past medical history. History reviewed. No pertinent surgical history. There are no active problems to display for this patient.   PCP: Dariel Lenis, MD (Inactive)  REFERRING PROVIDER: Yvone Rush, MD  THERAPY DIAG:  Right knee pain, unspecified chronicity  Muscle weakness  Unsteadiness on feet  Localized edema  REFERRING DIAG: S/P R Lateral Meniscus Repair    Rationale for Evaluation and Treatment:  Rehabilitation  SUBJECTIVE:  PERTINENT PAST HISTORY:  S/P R lateral meniscus repair         PRECAUTIONS:   OP Date: 12/08/2023  2 weeks 12/22/2023  4 weeks 01/05/2024  6 weeks 01/19/2024  8 weeks 02/02/2024  10 weeks 02/16/2024  12 weeks 03/01/2024    WEIGHT BEARING RESTRICTIONS see protocol  FALLS:  Has patient fallen in last 6 months? No, Number of falls: 0  MOI/History of condition:  Onset date: 7/16  SUBJECTIVE STATEMENT Pt presents to PT with no current reports of pain. Has been compliant with HEP.   EVAL: Pt is a 23 y.o. male who presents to clinic with chief complaint of R knee pain and stiffness following R lateral mensicus repair on 7/16.  He had a twisting injury when he rolled off the sofa and his foot was captured.  He plays baseball on the weekends.  He is not wearing a brace or using crutches on eval,  states that these were provided but he wanted to make sure his knee still worked.  Provided protocol but did not bring to clinic   Red flags:  denies   Pain:  Are you having pain? 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  Assistive Device: na  Hand Dominance: na  Patient Goals/Specific Activities: get back to baseball and work   OBJECTIVE:   DIAGNOSTIC FINDINGS:  None available in epic  GENERAL OBSERVATION/GAIT: Antalgic gait, no brace, ace bandage  SENSATION: Light touch: Appears intact  PALPATION: Diffuse pain and mild swelling R knee   LE MMT:  MMT Right (Eval) Left (Eval)  Hip flexion (L2, L3)    Knee extension (L3)    Knee flexion    Hip abduction    Hip extension    Hip external rotation    Hip internal rotation    Hip adduction    Ankle dorsiflexion (L4)    Ankle plantarflexion (S1)    Ankle inversion    Ankle eversion    Great Toe ext (L5)    Grossly     (Blank rows = not tested,  score listed is out of 5 possible points OR may be listed in lbs of force.  N = WNL, D = diminished, C = clear for gross weakness with myotome testing, * = concordant pain with testing)  LE ROM:  ROM Right (Eval) Left (Eval) Right 01/07/24 Right 01/11/24 Right 01/13/24 Right 01/18/24  Hip flexion        Hip extension        Hip abduction        Hip adduction        Hip internal rotation        Hip external rotation        Knee extension 12 0 7 5 3, 0  2  Knee flexion 45 130 78 84 82 94  Ankle dorsiflexion        Ankle plantarflexion        Ankle inversion        Ankle eversion         (Blank rows = not tested, N = WNL, * = concordant pain with testing)  Functional Tests  Eval                                                              PATIENT SURVEYS:  LEFS: 50/80   TODAY'S TREATMENT:  Therapeutic Exercise: Creating, reviewing, and completing below HEP   PATIENT EDUCATION ([place]/HM):  POC, diagnosis, prognosis, HEP, and outcome measures.  Pt educated via explanation, demonstration, and handout (HEP).  Pt confirms understanding verbally.   HOME EXERCISE PROGRAM: Access Code: [email or web address] URL: [email or web address] Date: 01/11/2024 Prepared by: Alm Kingdom  Exercises - Long Sitting Quad Set with Towel Roll Under Heel  - 3-4 x daily - 7 x weekly - 2 sets - 10 reps - 5'' hold - Supine Heel Slide with Strap  - 2 x daily - 7 x weekly - 3 sets - 10 reps - Prone Knee Extension Hang  - 1 x daily - 7 x weekly - 3 sets - 10 reps - 2-3 min hold - Sidelying Hip Abduction  - 1 x daily - 7 x weekly - 3 sets - 10 reps  Treatment priorities   Eval        Progress strength and ROM per protocol                                           TREATMENT [facility] Adult PT Treatment:                                      DATE: 01/25/24 Therapeutic Exercise: Rec bike no resistance x 3 min for flexion Standing hip abd 2x10 25lb ea Single leg press 3x8 40lb R TRX quats 2x10 (0-60) Prone hamstring curl 2x15 R Prone quad stretch with strap 3x30 R Bridge LE on swiss x 10 Bridge LE on swiss with roll 2x10 Supine SLR with QS 2x20 R Single leg calf raise 2x10 R Wall squat 3x10 (0-60) TKE with red power cord x 15 - 5 hold R Neuromuscular Re-Ed:  SLS on foam 3x30 R SLS on foam 4lb  ball into rebounder 3x30 Kickstand KB cross on foam 2x30 5lb R LE stance  [facility] Adult PT Treatment:                                      DATE: 01/20/24 Therapeutic Exercise: Rec bike no resistance x 3 min for flexion Standing hip abd 2x10 25lb ea Standing hip ext 2x10 25# R Single leg press 3x8 20lb Prone hamstring curl 2x10 R Prone quad stretch with strap 2x30 R Supine SLR with QS 3x10 R Bridge LE on swiss 2x15 TRX quats 2x10  (0-60) Standing calf raises x 20 Wall squat 2x10 (0-60) TKE with ball x 10 - 5 hold R Neuromuscular Re-Ed:  Supine QS with heel block x 15 - 5 hold R Tandem stance on foam 3x30 R back Wobble board fwd/bwd 2x45 SLS on foam 2x30 R  [facility] Adult PT Treatment:                                      DATE: 01/18/24 Therapeutic Exercise: Supine SLR with QS 2x10 R Bridge LE on swiss 2x10  S/L hip abd 2x15 R Supine heel slide with strap 5 x 10 R S/L clam 2x15 RTB R Standing calf raises x 20 Wall squat 2x10 Neuromuscular Re-Ed:  Supine QS with heel block x 10 - 5 hold R Attended [place] ESTIM 5/5 (on/off) x 8 min to R quad Wobble board fwd/bwd 2x45  [facility] Adult PT Treatment:                                      DATE: 01/13/24 Therapeutic Exercise: Supine QS with heel block 1x10 - 5 hold R Supine heel slide with strap 5 2x10 R Sidelying hip abduction 1x10 2x15 Standing calf raises 2x20 SLR with QS 2 x 10 Knee extension isometrics with mobilization seatbelt 2x10  SL balance  Lateral step over  Modalities: Attended [place] ESTIM 5/5 (on/off) x 8 min to R quad GameReady 10 min  R knee elevated Medium compression 36 degrees  [facility] Adult PT Treatment:                                                DATE: 01/11/24 Therapeutic Exercise: Supine QS with heel block 2x10 - 5 hold R Supine heel slide with strap 5 2x10 R Sidelying hip abduction 3x10 R Prone knee ext hang x 3 min Standing calf raises 2x20 Neuromuscular re-ed: Supine QS with heel block 3x10 - 5 hold R Seated QS 2x10 R - 5 hold R Modalities: Attended [place] ESTIM 5/5 (on/off) x 8 min to R quad GameReady 10 min  R knee elevated Medium compression 36 degrees  ASSESSMENT:  CLINICAL IMPRESSION: Pt was able to complete all prescribed exercises with no adverse effect. Exercises once again progressed quad and LE strength/balance per protocol. Continues to improve R knee strength and ROM post  operatively. Will assess goals and request more auth next session as he is still operating well below PLOF.   EVAL: Kenroy is a 23 y.o. male who presents to clinic with signs and  sxs consistent with R knee pain and stiffness following R lateral mensicus repair on 7/16.  Provided knee brace and crutches by MD but d/c'd on his own.  Discussed importance of following restrictions which typically include partial w/b and brace at this point; he confirms understanding.  Pt will bring protocol tomorrow.  [place] will benefit from skilled PT to address relevant deficits and improve knee strength and ROM to allow return to PLOF.   OBJECTIVE IMPAIRMENTS: Pain, LE strength, gait, balance, R knee ROM  ACTIVITY LIMITATIONS: standing, walking, squatting, lifting, running, recreation  PERSONAL FACTORS: See medical history and pertinent history   REHAB POTENTIAL: Good  CLINICAL DECISION MAKING: Evolving/moderate complexity  EVALUATION COMPLEXITY: Moderate   GOALS:   SHORT TERM GOALS: Target date: 02/03/2024   Quirino will be >75% HEP compliant to improve carryover between sessions and facilitate independent management of condition  Evaluation: ongoing Goal status: MET   LONG TERM GOALS: Target date: 03/02/2024   Myrick will self report >/= 50% decrease in pain from evaluation to improve function in daily tasks  Evaluation/Baseline: 6/10 max pain Goal status: INITIAL   2.  Donovyn will show a >/= 70/80 pt improvement in LEFS score (MCID is ~11% or 9 pts) as a proxy for functional improvement   Evaluation/Baseline: 50 pts Goal status: INITIAL   3.  Giuseppe will be able to return to strength training, not limited by pain  Evaluation/Baseline: limited Goal status: INITIAL   4.  Roylee will achieve full knee ROM to improve ability to complete transfers, squat, and navigate steps  Evaluation/Baseline: 12-45 degrees Goal status: INITIAL    PLAN: PT FREQUENCY: 1-2x/week  PT DURATION: 8  weeks  PLANNED INTERVENTIONS:  97164- PT Re-evaluation, 97110-Therapeutic exercises, 97530- Therapeutic activity, [id number]- Neuromuscular re-education, 97535- Self Care, 02859- Manual therapy, [id number]- Gait training, [id number]- Aquatic Therapy, [id number]- Electrical stimulation (manual), [id number]- Vasopneumatic device, [id number]- Traction (mechanical), [id number]- Ionotophoresis 4mg /ml Dexamethasone , Taping, Dry Needling, Joint manipulation, and Spinal manipulation.   Alm JAYSON Kingdom PT  01/25/24 2:11 PM

## 2024-01-27 ENCOUNTER — Ambulatory Visit: Admitting: Physical Therapy

## 2024-02-02 ENCOUNTER — Ambulatory Visit

## 2024-02-02 DIAGNOSIS — M25561 Pain in right knee: Secondary | ICD-10-CM | POA: Diagnosis not present

## 2024-02-02 DIAGNOSIS — R600 Localized edema: Secondary | ICD-10-CM

## 2024-02-02 DIAGNOSIS — M6281 Muscle weakness (generalized): Secondary | ICD-10-CM

## 2024-02-02 DIAGNOSIS — R2681 Unsteadiness on feet: Secondary | ICD-10-CM

## 2024-02-02 NOTE — Therapy (Signed)
 OUTPATIENT PHYSICAL THERAPY TREATMENT NOTE  Patient Name: Jeremy Schmidt MRN: [id number] DOB:08/02/2000, 23 y.o., male Today's Date: 02/02/2024   PT End of Session - 02/02/24 1528     Visit Number 8    Date for PT Re-Evaluation 03/02/24    Authorization Type [employer]    Authorization Time Period Approved 8 visits 01/06/24-03/06/24    Authorization - Visit Number 8    Authorization - Number of Visits 8    PT Start Time 1530    PT Stop Time 1612    PT Time Calculation (min) 42 min    Activity Tolerance Patient tolerated treatment well    Behavior During Therapy [facility] for tasks assessed/performed               History reviewed. No pertinent past medical history. History reviewed. No pertinent surgical history. There are no active problems to display for this patient.   PCP: Dariel Lenis, MD (Inactive)  REFERRING PROVIDER: Yvone Rush, MD  THERAPY DIAG:  Right knee pain, unspecified chronicity  Muscle weakness  Unsteadiness on feet  Localized edema  REFERRING DIAG: S/P R Lateral Meniscus Repair    Rationale for Evaluation and Treatment:  Rehabilitation  SUBJECTIVE:  PERTINENT PAST HISTORY:  S/P R lateral meniscus repair         PRECAUTIONS:   OP Date: 12/08/2023  2 weeks 12/22/2023  4 weeks 01/05/2024  6 weeks 01/19/2024  8 weeks 02/02/2024  10 weeks 02/16/2024  12 weeks 03/01/2024    WEIGHT BEARING RESTRICTIONS see protocol  FALLS:  Has patient fallen in last 6 months? No, Number of falls: 0  MOI/History of condition:  Onset date: 7/16  SUBJECTIVE STATEMENT Pt presents to PT with no current pain. Has been compliant with HEP. 7/10 pain at worst in last week, mainly with stair navigation.   EVAL: Pt is a 23 y.o. male who presents to clinic with chief complaint of R knee pain and stiffness following R lateral mensicus repair on 7/16.  He had a twisting injury when he rolled off the sofa and his foot was captured.  He plays baseball on the weekends.   He is not wearing a brace or using crutches on eval, states that these were provided but he wanted to make sure his knee still worked.  Provided protocol but did not bring to clinic   Red flags:  denies   Pain:  Are you having pain? 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  Assistive Device: na  Hand Dominance: na  Patient Goals/Specific Activities: get back to baseball and work   OBJECTIVE:   DIAGNOSTIC FINDINGS:  None available in epic  GENERAL OBSERVATION/GAIT: Antalgic gait, no brace, ace bandage  SENSATION: Light touch: Appears intact  PALPATION: Diffuse pain and mild swelling R knee   LE MMT:  MMT Right (Eval) Left (Eval)  Hip flexion (L2, L3)    Knee extension (L3)    Knee flexion    Hip abduction    Hip extension    Hip external rotation    Hip internal rotation    Hip adduction    Ankle dorsiflexion (L4)    Ankle plantarflexion (S1)    Ankle inversion    Ankle eversion    Great Toe ext (L5)  Grossly     (Blank rows = not tested, score listed is out of 5 possible points OR may be listed in lbs of force.  N = WNL, D = diminished, C = clear for gross weakness with myotome testing, * = concordant pain with testing)  LE ROM:  ROM Right (Eval) Left (Eval) Right 01/07/24 Right 01/11/24 Right 01/13/24 Right 01/18/24 Right 02/02/24  Hip flexion         Hip extension         Hip abduction         Hip adduction         Hip internal rotation         Hip external rotation         Knee extension 12 0 7 5 3, 0  2 5  Knee flexion 45 130 78 84 82 94 98  Ankle dorsiflexion         Ankle plantarflexion         Ankle inversion         Ankle eversion          (Blank rows = not tested, N = WNL, * = concordant pain with  testing)  Functional Tests  Eval                                                             PATIENT SURVEYS:  LEFS: 50/80   TODAY'S TREATMENT:  Therapeutic Exercise: Creating, reviewing, and completing below HEP   PATIENT EDUCATION ([place]/HM):  POC, diagnosis, prognosis, HEP, and outcome measures.  Pt educated via explanation, demonstration, and handout (HEP).  Pt confirms understanding verbally.   HOME EXERCISE PROGRAM: Access Code: [email or web address] URL: [email or web address] Date: 02/02/2024 Prepared by: Alm Kingdom  Exercises - Long Sitting Quad Set with Towel Roll Under Heel  - 3-4 x daily - 7 x weekly - 2 sets - 10 reps - 5'' hold - Active Straight Leg Raise with Quad Set  - 1 x daily - 7 x weekly - 3 sets - 15 reps - Supine Heel Slide with Strap  - 2 x daily - 7 x weekly - 3 sets - 10 reps - Prone Knee Extension Hang  - 1 x daily - 7 x weekly - 3 sets - 10 reps - 2-3 min hold - Wall Squat  - 1 x daily - 7 x weekly - 3 sets - 10 reps - 0-60 degrees hold - Supine Hamstring Stretch with Strap  - 1 x daily - 7 x weekly - 3 reps - 30 sec hold - Standing Hip Abduction with Resistance at Ankles and Counter Support  - 1 x daily - 7 x weekly - 3 sets - 10 reps - red band hold  Treatment priorities   Eval        Progress strength and ROM per protocol                                           TREATMENT [facility] Adult PT Treatment:  DATE: 02/02/24 Therapeutic Exercise: Rec bike no resistance x 3 min for flexion ROM Assessment of tests/measures, goals, and outcomes Supine QS with block under heel x 10 - 5 hold R Supine heel slide with strap x 10 - 5 hold R TRX squat 2x10 (0-60) Standing hip abd 2x10 25lb ea Step up 2x10 fwd 10in R stance no UE Single leg press 3x10 40lb R (0-60) Wall squat 3x10 (0-60) TKE with ball 2x15 R - 5 hold Supine hamstring stretch with strap 2x30 Neuromuscular Re-Ed:  SLS on foam 4lb  ball into rebounder 3x30 Wobbleboard fwd/bwd 2x45 Kickstand KB cross on foam 2x30 15lb R LE stance  ASSESSMENT:  CLINICAL IMPRESSION: Pt was able to complete all prescribed exercises with no adverse effect. Over the course of PT thus far he has been progressing well post surgery with improving strength, ROM, and functional mobility. He is still well below PLOF and desired R knee ROM post op. He is still having some quad weakness and decreased terminal knee extension, although it is continuing to improve. He continues to require skilled PT services working on continued progression through protocol as he is only 8 weeks post op and not able to perform the desired athletic activities he was doing prior to injury.   EVAL: Rulon is a 23 y.o. male who presents to clinic with signs and sxs consistent with R knee pain and stiffness following R lateral mensicus repair on 7/16.  Provided knee brace and crutches by MD but d/c'd on his own.  Discussed importance of following restrictions which typically include partial w/b and brace at this point; he confirms understanding.  Pt will bring protocol tomorrow.  Marcel will benefit from skilled PT to address relevant deficits and improve knee strength and ROM to allow return to PLOF.   OBJECTIVE IMPAIRMENTS: Pain, LE strength, gait, balance, R knee ROM  ACTIVITY LIMITATIONS: standing, walking, squatting, lifting, running, recreation  PERSONAL FACTORS: See medical history and pertinent history   REHAB POTENTIAL: Good  CLINICAL DECISION MAKING: Evolving/moderate complexity  EVALUATION COMPLEXITY: Moderate   GOALS:   SHORT TERM GOALS: Target date: 02/03/2024   Myles will be >75% HEP compliant to improve carryover between sessions and facilitate independent management of condition  Evaluation: ongoing Goal status: MET   LONG TERM GOALS: Target date: 03/02/2024   Sho will self report >/= 50% decrease in pain from evaluation to improve  function in daily tasks  Evaluation/Baseline: 6/10 max pain 02/02/24: 7/10 at worst Goal status: INITIAL   2.  Olliver will show a >/= 70/80 pt improvement in LEFS score (MCID is ~11% or 9 pts) as a proxy for functional improvement   Evaluation/Baseline: 50 pts 02/02/2024: 50/80 Goal status: IN PROGRESS   3.  Treshon will be able to return to strength training, not limited by pain  Evaluation/Baseline: limited Goal status: INITIAL   4.  Yeshua will achieve full knee ROM to improve ability to complete transfers, squat, and navigate steps  Evaluation/Baseline: 12-45 degrees Goal status: INITIAL    PLAN: PT FREQUENCY: 1-2x/week  PT DURATION: 8 weeks  PLANNED INTERVENTIONS:  97164- PT Re-evaluation, 97110-Therapeutic exercises, 97530- Therapeutic activity, [id number]- Neuromuscular re-education, 97535- Self Care, [id number]- Manual therapy, [id number]- Gait training, [id number]- Aquatic Therapy, [id number]- Electrical stimulation (manual), [id number]- Vasopneumatic device, [id number]- Traction (mechanical), [id number]- Ionotophoresis 4mg /ml Dexamethasone , Taping, Dry Needling, Joint manipulation, and Spinal manipulation.  [employer] Authorization   Choose one: Rehabilitative  Standardized Assessment or Functional Outcome Tool: See Pain Assessment and LEFS  Score  or Percent Disability: 50/80   Body Parts Treated (Select each separately):  Knee. Overall deficits/functional limitations for body part selected: moderate N/A. Overall deficits/functional limitations for body part selected: N/A N/A. Overall deficits/functional limitations for body part selected: N/A   If treatment provided at initial evaluation, no treatment charged due to lack of authorization.    Alm JAYSON Kingdom PT  02/02/24 4:13 PM

## 2024-02-08 ENCOUNTER — Ambulatory Visit

## 2024-02-08 DIAGNOSIS — M25561 Pain in right knee: Secondary | ICD-10-CM

## 2024-02-08 DIAGNOSIS — M6281 Muscle weakness (generalized): Secondary | ICD-10-CM

## 2024-02-08 NOTE — Therapy (Signed)
 OUTPATIENT PHYSICAL THERAPY TREATMENT NOTE  Patient Name: Jeremy Schmidt MRN: [id number] DOB:2000-07-14, 23 y.o., male Today's Date: 02/08/2024   PT End of Session - 02/08/24 1141     Visit Number 9    Date for PT Re-Evaluation 03/02/24    Authorization Type [employer]    Authorization Time Period Submitted for 12 visits starting 02/07/24    Authorization - Number of Visits --    PT Start Time 1145    PT Stop Time 1223    PT Time Calculation (min) 38 min    Activity Tolerance Patient tolerated treatment well    Behavior During Therapy [facility] for tasks assessed/performed                History reviewed. No pertinent past medical history. History reviewed. No pertinent surgical history. There are no active problems to display for this patient.   PCP: Dariel Lenis, MD (Inactive)  REFERRING PROVIDER: Yvone Rush, MD  THERAPY DIAG:  Right knee pain, unspecified chronicity  Muscle weakness  REFERRING DIAG: S/P R Lateral Meniscus Repair    Rationale for Evaluation and Treatment:  Rehabilitation  SUBJECTIVE:  PERTINENT PAST HISTORY:  S/P R lateral meniscus repair         PRECAUTIONS:   OP Date: 12/08/2023  2 weeks 12/22/2023  4 weeks 01/05/2024  6 weeks 01/19/2024  8 weeks 02/02/2024  10 weeks 02/16/2024  12 weeks 03/01/2024    WEIGHT BEARING RESTRICTIONS see protocol  FALLS:  Has patient fallen in last 6 months? No, Number of falls: 0  MOI/History of condition:  Onset date: 7/16  SUBJECTIVE STATEMENT Pt arrives with no current pain. Has been compliant with HEP.   EVAL: Pt is a 23 y.o. male who presents to clinic with chief complaint of R knee pain and stiffness following R lateral mensicus repair on 7/16.  He had a twisting injury when he rolled off the sofa and his foot was captured.  He plays baseball on the weekends.  He is not wearing a brace or using crutches on eval, states that these were provided but he wanted to make sure his knee still worked.   Provided protocol but did not bring to clinic   Red flags:  denies   Pain:  Are you having pain? 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  Assistive Device: na  Hand Dominance: na  Patient Goals/Specific Activities: get back to baseball and work   OBJECTIVE:   DIAGNOSTIC FINDINGS:  None available in epic  GENERAL OBSERVATION/GAIT: Antalgic gait, no brace, ace bandage  SENSATION: Light touch: Appears intact  PALPATION: Diffuse pain and mild swelling R knee   LE MMT:  MMT Right (Eval) Left (Eval)  Hip flexion (L2, L3)    Knee extension (L3)    Knee flexion    Hip abduction    Hip extension    Hip external rotation    Hip internal rotation    Hip adduction    Ankle dorsiflexion (L4)    Ankle plantarflexion (S1)    Ankle inversion    Ankle eversion    Great Toe ext (L5)    Grossly     (Blank rows = not tested, score listed is out of 5 possible points OR may be listed in lbs of  force.  N = WNL, D = diminished, C = clear for gross weakness with myotome testing, * = concordant pain with testing)  LE ROM:  ROM Right (Eval) Left (Eval) Right 01/07/24 Right 01/11/24 Right 01/13/24 Right 01/18/24 Right 02/02/24 Right 02/08/24  Hip flexion          Hip extension          Hip abduction          Hip adduction          Hip internal rotation          Hip external rotation          Knee extension 12 0 7 5 3, 0  2 5 3   Knee flexion 45 130 78 84 82 94 98 105  Ankle dorsiflexion          Ankle plantarflexion          Ankle inversion          Ankle eversion           (Blank rows = not tested, N = WNL, * = concordant pain with testing)  Functional Tests  Eval                                                             PATIENT  SURVEYS:  LEFS: 50/80   TODAY'S TREATMENT:  Therapeutic Exercise: Creating, reviewing, and completing below HEP   PATIENT EDUCATION ([place]/HM):  POC, diagnosis, prognosis, HEP, and outcome measures.  Pt educated via explanation, demonstration, and handout (HEP).  Pt confirms understanding verbally.   HOME EXERCISE PROGRAM: Access Code: [email or web address] URL: [email or web address] Date: 02/02/2024 Prepared by: Alm Kingdom  Exercises - Long Sitting Quad Set with Towel Roll Under Heel  - 3-4 x daily - 7 x weekly - 2 sets - 10 reps - 5'' hold - Active Straight Leg Raise with Quad Set  - 1 x daily - 7 x weekly - 3 sets - 15 reps - Supine Heel Slide with Strap  - 2 x daily - 7 x weekly - 3 sets - 10 reps - Prone Knee Extension Hang  - 1 x daily - 7 x weekly - 3 sets - 10 reps - 2-3 min hold - Wall Squat  - 1 x daily - 7 x weekly - 3 sets - 10 reps - 0-60 degrees hold - Supine Hamstring Stretch with Strap  - 1 x daily - 7 x weekly - 3 reps - 30 sec hold - Standing Hip Abduction with Resistance at Ankles and Counter Support  - 1 x daily - 7 x weekly - 3 sets - 10 reps - red band hold  Treatment priorities   Eval        Progress strength and ROM per protocol                                           TREATMENT [facility] Adult PT Treatment:                                      DATE: 02/08/24  Therapeutic Exercise: Rec bike no resistance x 3 min for flexion ROM TKE with ball x 15 R - 5 hold TRX squat 3x10 (0-60) TRX lunge 2x10 (0-60) Standing hip abd 2x10 30lb ea Single leg press 3x10 40lb R (0-60) Step up with march 2x10 R stance - no UE 8in Lateral step up 2x10 R stance - no UE 8in Wall squat 2x10 (0-60) Wall squat 2x10 (0-60) Supine hamstring stretch with strap 2x60 R Prone quad stretch x 60 R Supine SLR 2x20 R  ASSESSMENT:  CLINICAL IMPRESSION: Pt was able to complete all prescribed exercises with no adverse effect. Exercises once again progressed quad and LE  strength/balance per protocol. Continues to improve R knee ROM and functional strength. Will continue to progress as tolerated per POC.   EVAL: Jeremy Schmidt is a 23 y.o. male who presents to clinic with signs and sxs consistent with R knee pain and stiffness following R lateral mensicus repair on 7/16.  Provided knee brace and crutches by MD but d/c'd on his own.  Discussed importance of following restrictions which typically include partial w/b and brace at this point; he confirms understanding.  Pt will bring protocol tomorrow.  Clair will benefit from skilled PT to address relevant deficits and improve knee strength and ROM to allow return to PLOF.   OBJECTIVE IMPAIRMENTS: Pain, LE strength, gait, balance, R knee ROM  ACTIVITY LIMITATIONS: standing, walking, squatting, lifting, running, recreation  PERSONAL FACTORS: See medical history and pertinent history   REHAB POTENTIAL: Good  CLINICAL DECISION MAKING: Evolving/moderate complexity  EVALUATION COMPLEXITY: Moderate   GOALS:   SHORT TERM GOALS: Target date: 02/03/2024   Roben will be >75% HEP compliant to improve carryover between sessions and facilitate independent management of condition  Evaluation: ongoing Goal status: MET   LONG TERM GOALS: Target date: 03/02/2024   Jujuan will self report >/= 50% decrease in pain from evaluation to improve function in daily tasks  Evaluation/Baseline: 6/10 max pain 02/02/24: 7/10 at worst Goal status: INITIAL   2.  Meer will show a >/= 70/80 pt improvement in LEFS score (MCID is ~11% or 9 pts) as a proxy for functional improvement   Evaluation/Baseline: 50 pts 02/02/2024: 50/80 Goal status: IN PROGRESS   3.  Kaemon will be able to return to strength training, not limited by pain  Evaluation/Baseline: limited Goal status: INITIAL   4.  Keyden will achieve full knee ROM to improve ability to complete transfers, squat, and navigate steps  Evaluation/Baseline: 12-45  degrees Goal status: INITIAL    PLAN: PT FREQUENCY: 1-2x/week  PT DURATION: 8 weeks  PLANNED INTERVENTIONS:  97164- PT Re-evaluation, 97110-Therapeutic exercises, 97530- Therapeutic activity, [id number]- Neuromuscular re-education, 97535- Self Care, 02859- Manual therapy, [id number]- Gait training, [id number]- Aquatic Therapy, [phone]- Electrical stimulation (manual), [id number]- Vasopneumatic device, [id number]- Traction (mechanical), [id number]- Ionotophoresis 4mg /ml Dexamethasone , Taping, Dry Needling, Joint manipulation, and Spinal manipulation.  [employer] Authorization   Choose one: Rehabilitative  Standardized Assessment or Functional Outcome Tool: See Pain Assessment and LEFS  Score or Percent Disability: 50/80   Body Parts Treated (Select each separately):  Knee. Overall deficits/functional limitations for body part selected: moderate N/A. Overall deficits/functional limitations for body part selected: N/A N/A. Overall deficits/functional limitations for body part selected: N/A   If treatment provided at initial evaluation, no treatment charged due to lack of authorization.    Alm JAYSON Kingdom PT  02/08/24 3:51 PM

## 2024-02-10 ENCOUNTER — Ambulatory Visit

## 2024-02-10 DIAGNOSIS — M25561 Pain in right knee: Secondary | ICD-10-CM | POA: Diagnosis not present

## 2024-02-10 DIAGNOSIS — R2681 Unsteadiness on feet: Secondary | ICD-10-CM

## 2024-02-10 DIAGNOSIS — M6281 Muscle weakness (generalized): Secondary | ICD-10-CM

## 2024-02-10 NOTE — Therapy (Signed)
 OUTPATIENT PHYSICAL THERAPY TREATMENT NOTE  Patient Name: Jeremy Schmidt MRN: [id number] DOB:13-Sep-2000, 23 y.o., male Today's Date: 02/10/2024   PT End of Session - 02/10/24 1100     Visit Number 10    Date for Recertification  03/02/24    Authorization Type [employer]    Authorization Time Period Submitted for 12 visits starting 02/07/24    PT Start Time 1100    PT Stop Time 1140    PT Time Calculation (min) 40 min    Activity Tolerance Patient tolerated treatment well    Behavior During Therapy [facility] for tasks assessed/performed           History reviewed. No pertinent past medical history. History reviewed. No pertinent surgical history. There are no active problems to display for this patient.   PCP: Jeremy Lenis, MD (Inactive)  REFERRING PROVIDER: Yvone Rush, MD  THERAPY DIAG:  Right knee pain, unspecified chronicity  Muscle weakness  Unsteadiness on feet  REFERRING DIAG: S/P R Lateral Meniscus Repair    Rationale for Evaluation and Treatment:  Rehabilitation  SUBJECTIVE:  PERTINENT PAST HISTORY:  S/P R lateral meniscus repair         PRECAUTIONS:   OP Date: 12/08/2023  2 weeks 12/22/2023  4 weeks 01/05/2024  6 weeks 01/19/2024  8 weeks 02/02/2024  10 weeks 02/16/2024  12 weeks 03/01/2024    WEIGHT BEARING RESTRICTIONS see protocol  FALLS:  Has patient fallen in last 6 months? No, Number of falls: 0  MOI/History of condition:  Onset date: 7/16  SUBJECTIVE STATEMENT Pt presents to PT with no current pain. Has been compliant with HEP.  EVAL: Pt is a 23 y.o. male who presents to clinic with chief complaint of R knee pain and stiffness following R lateral mensicus repair on 7/16.  He had a twisting injury when he rolled off the sofa and his foot was captured.  He plays baseball on the weekends.  He is not wearing a brace or using crutches on eval, states that these were provided but he wanted to make sure his knee still worked.  Provided protocol but  did not bring to clinic   Red flags:  denies   Pain:  Are you having pain? 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  Assistive Device: na  Hand Dominance: na  Patient Goals/Specific Activities: get back to baseball and work   OBJECTIVE:   DIAGNOSTIC FINDINGS:  None available in epic  GENERAL OBSERVATION/GAIT: Antalgic gait, no brace, ace bandage  SENSATION: Light touch: Appears intact  PALPATION: Diffuse pain and mild swelling R knee   LE MMT:  MMT Right (Eval) Left (Eval)  Hip flexion (L2, L3)    Knee extension (L3)    Knee flexion    Hip abduction    Hip extension    Hip external rotation    Hip internal rotation    Hip adduction    Ankle dorsiflexion (L4)    Ankle plantarflexion (S1)    Ankle inversion    Ankle eversion    Great Toe ext (L5)    Grossly     (Blank rows = not tested, score listed is out of 5 possible points OR may be listed in lbs of force.  N = WNL, D = diminished, C =  clear for gross weakness with myotome testing, * = concordant pain with testing)  LE ROM:  ROM Right (Eval) Left (Eval) Right 01/07/24 Right 01/11/24 Right 01/13/24 Right 01/18/24 Right 02/02/24 Right 02/08/24  Hip flexion          Hip extension          Hip abduction          Hip adduction          Hip internal rotation          Hip external rotation          Knee extension 12 0 7 5 3, 0  2 5 3   Knee flexion 45 130 78 84 82 94 98 105  Ankle dorsiflexion          Ankle plantarflexion          Ankle inversion          Ankle eversion           (Blank rows = not tested, N = WNL, * = concordant pain with testing)  Functional Tests  Eval                                                             PATIENT SURVEYS:  LEFS:  50/80   TODAY'S TREATMENT:  Therapeutic Exercise: Creating, reviewing, and completing below HEP   PATIENT EDUCATION ([place]/HM):  POC, diagnosis, prognosis, HEP, and outcome measures.  Pt educated via explanation, demonstration, and handout (HEP).  Pt confirms understanding verbally.   HOME EXERCISE PROGRAM: Access Code: [email or web address] URL: [email or web address] Date: 02/02/2024 Prepared by: Jeremy Schmidt  Exercises - Long Sitting Quad Set with Towel Roll Under Heel  - 3-4 x daily - 7 x weekly - 2 sets - 10 reps - 5'' hold - Active Straight Leg Raise with Quad Set  - 1 x daily - 7 x weekly - 3 sets - 15 reps - Supine Heel Slide with Strap  - 2 x daily - 7 x weekly - 3 sets - 10 reps - Prone Knee Extension Hang  - 1 x daily - 7 x weekly - 3 sets - 10 reps - 2-3 min hold - Wall Squat  - 1 x daily - 7 x weekly - 3 sets - 10 reps - 0-60 degrees hold - Supine Hamstring Stretch with Strap  - 1 x daily - 7 x weekly - 3 reps - 30 sec hold - Standing Hip Abduction with Resistance at Ankles and Counter Support  - 1 x daily - 7 x weekly - 3 sets - 10 reps - red band hold  Treatment priorities   Eval        Progress strength and ROM per protocol                                           TREATMENT [facility] Adult PT Treatment:                                      DATE: 02/10/24 Rec bike lvl 3 x 3 min for flexion ROM  Seated hamstring curl 3x8 35lb R only TRX squat 3x10 - range as tolerated TRX lunge 3x8 R - range as tolerated Lateral step up 2x10 R stance - no UE 8in Step up with march R stance 2x10 - 8in Lateral walk with athletic stance RTB x 5 laps at [employer] walk 3x30ft RTB SLS on foam with rebounder 6lb toss 3x30 R Fl[place]o RDL on foam R 2x8 6lb ball Standing hip abd 3x10 37.5lb ea Single leg press 2x15 40lb R Hex bar DL 3x8 Wall sit 90 7k69  ASSESSMENT:  CLINICAL IMPRESSION: Pt was able to complete all prescribed exercises with no adverse effect. Exercises  once again progressed quad and LE strength/balance per protocol. Continues to improve R knee ROM and functional strength. Noted quad fatigue post session today. Will continue to progress as tolerated per POC.   EVAL: Jeremy Schmidt is a 23 y.o. male who presents to clinic with signs and sxs consistent with R knee pain and stiffness following R lateral mensicus repair on 7/16.  Provided knee brace and crutches by MD but d/c'd on his own.  Discussed importance of following restrictions which typically include partial w/b and brace at this point; he confirms understanding.  Pt will bring protocol tomorrow.  Taniela will benefit from skilled PT to address relevant deficits and improve knee strength and ROM to allow return to PLOF.   OBJECTIVE IMPAIRMENTS: Pain, LE strength, gait, balance, R knee ROM  ACTIVITY LIMITATIONS: standing, walking, squatting, lifting, running, recreation  PERSONAL FACTORS: See medical history and pertinent history   REHAB POTENTIAL: Good  CLINICAL DECISION MAKING: Evolving/moderate complexity  EVALUATION COMPLEXITY: Moderate   GOALS:   SHORT TERM GOALS: Target date: 02/03/2024   Ramses will be >75% HEP compliant to improve carryover between sessions and facilitate independent management of condition  Evaluation: ongoing Goal status: MET   LONG TERM GOALS: Target date: 03/02/2024   Dametrius will self report >/= 50% decrease in pain from evaluation to improve function in daily tasks  Evaluation/Baseline: 6/10 max pain 02/02/24: 7/10 at worst Goal status: INITIAL   2.  Garrit will show a >/= 70/80 pt improvement in LEFS score (MCID is ~11% or 9 pts) as a proxy for functional improvement   Evaluation/Baseline: 50 pts 02/02/2024: 50/80 Goal status: IN PROGRESS   3.  Sebastin will be able to return to strength training, not limited by pain  Evaluation/Baseline: limited Goal status: INITIAL   4.  Nickalous will achieve full knee ROM to improve ability to complete  transfers, squat, and navigate steps  Evaluation/Baseline: 12-45 degrees Goal status: INITIAL    PLAN: PT FREQUENCY: 1-2x/week  PT DURATION: 8 weeks  PLANNED INTERVENTIONS:  97164- PT Re-evaluation, 97110-Therapeutic exercises, 97530- Therapeutic activity, [id number]- Neuromuscular re-education, 97535- Self Care, 02859- Manual therapy, [id number]- Gait training, [id number]- Aquatic Therapy, [phone]- Electrical stimulation (manual), [id number]- Vasopneumatic device, [id number]- Traction (mechanical), [id number]- Ionotophoresis 4mg /ml Dexamethasone , Taping, Dry Needling, Joint manipulation, and Spinal manipulation.  [employer] Authorization   Choose one: Rehabilitative  Standardized Assessment or Functional Outcome Tool: See Pain Assessment and LEFS  Score or Percent Disability: 50/80   Body Parts Treated (Select each separately):  Knee. Overall deficits/functional limitations for body part selected: moderate N/A. Overall deficits/functional limitations for body part selected: N/A N/A. Overall deficits/functional limitations for body part selected: N/A   If treatment provided at initial evaluation, no treatment charged due to lack of authorization.    Jeremy JAYSON Schmidt PT  02/10/24 11:41 AM

## 2024-02-15 ENCOUNTER — Ambulatory Visit

## 2024-02-15 NOTE — Therapy (Incomplete)
 OUTPATIENT PHYSICAL THERAPY TREATMENT NOTE  Patient Name: Jeremy Schmidt MRN: [id number] DOB:[birth year], 23 y.o., male Today's Date: 02/15/2024      No past medical history on file. No past surgical history on file. There are no active problems to display for this patient.   PCP: Dariel Lenis, MD (Inactive)  REFERRING PROVIDER: Yvone Rush, MD  THERAPY DIAG:  No diagnosis found.  REFERRING DIAG: S/P R Lateral Meniscus Repair    Rationale for Evaluation and Treatment:  Rehabilitation  SUBJECTIVE:  PERTINENT PAST HISTORY:  S/P R lateral meniscus repair         PRECAUTIONS:   OP Date: 12/08/2023  2 weeks 12/22/2023  4 weeks 01/05/2024  6 weeks 01/19/2024  8 weeks 02/02/2024  10 weeks 02/16/2024  12 weeks 03/01/2024    WEIGHT BEARING RESTRICTIONS see protocol  FALLS:  Has patient fallen in last 6 months? No, Number of falls: 0  MOI/History of condition:  Onset date: 7/16  SUBJECTIVE STATEMENT ***  EVAL: Pt is a 23 y.o. male who presents to clinic with chief complaint of R knee pain and stiffness following R lateral mensicus repair on 7/16.  He had a twisting injury when he rolled off the sofa and his foot was captured.  He plays baseball on the weekends.  He is not wearing a brace or using crutches on eval, states that these were provided but he wanted to make sure his knee still worked.  Provided protocol but did not bring to clinic   Red flags:  denies   Pain:  Are you having pain? 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  Assistive Device: na  Hand Dominance: na  Patient Goals/Specific Activities: get back to baseball and work   OBJECTIVE:   DIAGNOSTIC FINDINGS:  None available in epic  GENERAL OBSERVATION/GAIT: Antalgic  gait, no brace, ace bandage  SENSATION: Light touch: Appears intact  PALPATION: Diffuse pain and mild swelling R knee   LE MMT:  MMT Right (Eval) Left (Eval)  Hip flexion (L2, L3)    Knee extension (L3)    Knee flexion    Hip abduction    Hip extension    Hip external rotation    Hip internal rotation    Hip adduction    Ankle dorsiflexion (L4)    Ankle plantarflexion (S1)    Ankle inversion    Ankle eversion    Great Toe ext (L5)    Grossly     (Blank rows = not tested, score listed is out of 5 possible points OR may be listed in lbs of force.  N = WNL, D = diminished, C = clear for gross weakness with myotome testing, * = concordant pain with testing)  LE ROM:  ROM Right (Eval) Left (Eval) Right 01/07/24 Right 01/11/24 Right 01/13/24 Right 01/18/24 Right 02/02/24 Right 02/08/24  Hip flexion          Hip extension          Hip abduction          Hip adduction          Hip internal rotation          Hip external rotation          Knee extension 12 0 7 5 3,  0  2 5 3   Knee flexion 45 130 78 84 82 94 98 105  Ankle dorsiflexion          Ankle plantarflexion          Ankle inversion          Ankle eversion           (Blank rows = not tested, N = WNL, * = concordant pain with testing)  Functional Tests  Eval                                                             PATIENT SURVEYS:  LEFS: 50/80   TODAY'S TREATMENT:  Therapeutic Exercise: Creating, reviewing, and completing below HEP   PATIENT EDUCATION ([place]/HM):  POC, diagnosis, prognosis, HEP, and outcome measures.  Pt educated via explanation, demonstration, and handout (HEP).  Pt confirms understanding verbally.   HOME EXERCISE PROGRAM: Access Code: [email or web address] URL: [email or web address] Date: 02/02/2024 Prepared by: Jeremy Schmidt  Exercises - Long Sitting Quad Set with Towel Roll Under Heel  - 3-4 x daily - 7 x weekly - 2 sets - 10 reps - 5'' hold - Active Straight Leg  Raise with Quad Set  - 1 x daily - 7 x weekly - 3 sets - 15 reps - Supine Heel Slide with Strap  - 2 x daily - 7 x weekly - 3 sets - 10 reps - Prone Knee Extension Hang  - 1 x daily - 7 x weekly - 3 sets - 10 reps - 2-3 min hold - Wall Squat  - 1 x daily - 7 x weekly - 3 sets - 10 reps - 0-60 degrees hold - Supine Hamstring Stretch with Strap  - 1 x daily - 7 x weekly - 3 reps - 30 sec hold - Standing Hip Abduction with Resistance at Ankles and Counter Support  - 1 x daily - 7 x weekly - 3 sets - 10 reps - red band hold  Treatment priorities   Eval        Progress strength and ROM per protocol                                           TREATMENT [facility] Adult PT Treatment:                                      DATE: 02/15/24 Rec bike lvl 3 x 3 min for flexion ROM Seated hamstring curl 3x8 35lb R only TRX squat 3x10 - range as tolerated TRX lunge 3x8 R - range as tolerated Lateral step up 2x10 R stance - no UE 8in Step up with march R stance 2x10 - 8in Lateral walk with athletic stance RTB x 5 laps at [employer] walk 3x31ft RTB SLS on foam with rebounder 6lb toss 3x30 R [place] RDL on foam R 2x8 6lb ball Standing hip abd 3x10 37.5lb ea Single leg press 2x15 40lb R Hex bar DL 3x8 Wall sit 90 7k69  [facility] Adult PT Treatment:  DATE: 02/10/24 Rec bike lvl 3 x 3 min for flexion ROM Seated hamstring curl 3x8 35lb R only TRX squat 3x10 - range as tolerated TRX lunge 3x8 R - range as tolerated Lateral step up 2x10 R stance - no UE 8in Step up with march R stance 2x10 - 8in Lateral walk with athletic stance RTB x 5 laps at [employer] walk 3x44ft RTB SLS on foam with rebounder 6lb toss 3x30 R Fl[place]o RDL on foam R 2x8 6lb ball Standing hip abd 3x10 37.5lb ea Single leg press 2x15 40lb R Hex bar DL 3x8 Wall sit 90 7k69  ASSESSMENT:  CLINICAL IMPRESSION: ***  EVAL: Jeremy Schmidt is a 23 y.o. male who presents to clinic with signs and  sxs consistent with R knee pain and stiffness following R lateral mensicus repair on 7/16.  Provided knee brace and crutches by MD but d/c'd on his own.  Discussed importance of following restrictions which typically include partial w/b and brace at this point; he confirms understanding.  Pt will bring protocol tomorrow.  D[place]ion will benefit from skilled PT to address relevant deficits and improve knee strength and ROM to allow return to PLOF.   OBJECTIVE IMPAIRMENTS: Pain, LE strength, gait, balance, R knee ROM  ACTIVITY LIMITATIONS: standing, walking, squatting, lifting, running, recreation  PERSONAL FACTORS: See medical history and pertinent history   REHAB POTENTIAL: Good  CLINICAL DECISION MAKING: Evolving/moderate complexity  EVALUATION COMPLEXITY: Moderate   GOALS:   SHORT TERM GOALS: Target date: 02/03/2024   Yedidya will be >75% HEP compliant to improve carryover between sessions and facilitate independent management of condition  Evaluation: ongoing Goal status: MET   LONG TERM GOALS: Target date: 03/02/2024   Jahsiah will self report >/= 50% decrease in pain from evaluation to improve function in daily tasks  Evaluation/Baseline: 6/10 max pain 02/02/24: 7/10 at worst Goal status: INITIAL   2.  Ashleigh will show a >/= 70/80 pt improvement in LEFS score (MCID is ~11% or 9 pts) as a proxy for functional improvement   Evaluation/Baseline: 50 pts 02/02/2024: 50/80 Goal status: IN PROGRESS   3.  Raoul will be able to return to strength training, not limited by pain  Evaluation/Baseline: limited Goal status: INITIAL   4.  Mick will achieve full knee ROM to improve ability to complete transfers, squat, and navigate steps  Evaluation/Baseline: 12-45 degrees Goal status: INITIAL    PLAN: PT FREQUENCY: 1-2x/week  PT DURATION: 8 weeks  PLANNED INTERVENTIONS:  97164- PT Re-evaluation, 97110-Therapeutic exercises, 97530- Therapeutic activity, [id number]-  Neuromuscular re-education, 97535- Self Care, 02859- Manual therapy, [id number]- Gait training, [id number]- Aquatic Therapy, [phone]- Electrical stimulation (manual), [id number]- Vasopneumatic device, [id number]- Traction (mechanical), [id number]- Ionotophoresis 4mg /ml Dexamethasone , Taping, Dry Needling, Joint manipulation, and Spinal manipulation.  [employer] Authorization   Choose one: Rehabilitative  Standardized Assessment or Functional Outcome Tool: See Pain Assessment and LEFS  Score or Percent Disability: 50/80   Body Parts Treated (Select each separately):  Knee. Overall deficits/functional limitations for body part selected: moderate N/A. Overall deficits/functional limitations for body part selected: N/A N/A. Overall deficits/functional limitations for body part selected: N/A   If treatment provided at initial evaluation, no treatment charged due to lack of authorization.    Jeremy Schmidt PT  02/15/24 7:53 AM

## 2024-02-17 ENCOUNTER — Telehealth: Payer: Self-pay | Admitting: Physical Therapy

## 2024-02-17 ENCOUNTER — Ambulatory Visit: Admitting: Physical Therapy

## 2024-02-17 NOTE — Telephone Encounter (Signed)
 Left voicemail regarding second no show this week. Requested call back if he would like to schedule an appointment.
